# Patient Record
Sex: Male | Born: 1950 | Race: Black or African American | Hispanic: No | Marital: Married | Smoking: Never smoker
Health system: Southern US, Community
[De-identification: ages and names within clinical notes are randomized; demographics above are authoritative.]

## PROBLEM LIST (undated history)

## (undated) DIAGNOSIS — E785 Hyperlipidemia, unspecified: Secondary | ICD-10-CM

## (undated) DIAGNOSIS — E1165 Type 2 diabetes mellitus with hyperglycemia: Secondary | ICD-10-CM

## (undated) DIAGNOSIS — I10 Essential (primary) hypertension: Secondary | ICD-10-CM

## (undated) DIAGNOSIS — E1140 Type 2 diabetes mellitus with diabetic neuropathy, unspecified: Secondary | ICD-10-CM

## (undated) DIAGNOSIS — IMO0002 Reserved for concepts with insufficient information to code with codable children: Secondary | ICD-10-CM

## (undated) HISTORY — DX: Reserved for concepts with insufficient information to code with codable children: IMO0002

## (undated) HISTORY — DX: Hyperlipidemia, unspecified: E78.5

## (undated) HISTORY — DX: Type 2 diabetes mellitus with diabetic neuropathy, unspecified: E11.40

## (undated) HISTORY — DX: Type 2 diabetes mellitus with hyperglycemia: E11.65

## (undated) HISTORY — DX: Essential (primary) hypertension: I10

---

## 2010-02-03 ENCOUNTER — Ambulatory Visit: Payer: Self-pay | Admitting: Internal Medicine

## 2010-02-03 ENCOUNTER — Encounter (INDEPENDENT_AMBULATORY_CARE_PROVIDER_SITE_OTHER): Payer: Self-pay | Admitting: Family Medicine

## 2010-02-03 LAB — CONVERTED CEMR LAB
Albumin: 4 g/dL (ref 3.5–5.2)
Alkaline Phosphatase: 58 units/L (ref 39–117)
Basophils Absolute: 0 10*3/uL (ref 0.0–0.1)
Benzodiazepines.: NEGATIVE
Calcium: 9 mg/dL (ref 8.4–10.5)
Chloride: 109 meq/L (ref 96–112)
Creatinine,U: 244 mg/dL
Glucose, Bld: 120 mg/dL — ABNORMAL HIGH (ref 70–99)
HCT: 39.5 % (ref 39.0–52.0)
Hemoglobin: 12.9 g/dL — ABNORMAL LOW (ref 13.0–17.0)
Lymphocytes Relative: 35 % (ref 12–46)
Lymphs Abs: 2.1 10*3/uL (ref 0.7–4.0)
Marijuana Metabolite: NEGATIVE
Methadone: NEGATIVE
Monocytes Absolute: 0.4 10*3/uL (ref 0.1–1.0)
Monocytes Relative: 6 % (ref 3–12)
Neutro Abs: 3.4 10*3/uL (ref 1.7–7.7)
Opiate Screen, Urine: NEGATIVE
Potassium: 3.3 meq/L — ABNORMAL LOW (ref 3.5–5.3)
Propoxyphene: NEGATIVE
RBC: 4.52 M/uL (ref 4.22–5.81)
RDW: 13.8 % (ref 11.5–15.5)
Sodium: 143 meq/L (ref 135–145)
Total Protein: 6.5 g/dL (ref 6.0–8.3)
WBC: 6 10*3/uL (ref 4.0–10.5)

## 2010-02-19 ENCOUNTER — Ambulatory Visit: Payer: Self-pay | Admitting: Internal Medicine

## 2010-02-19 ENCOUNTER — Encounter (INDEPENDENT_AMBULATORY_CARE_PROVIDER_SITE_OTHER): Payer: Self-pay | Admitting: Family Medicine

## 2010-02-19 LAB — CONVERTED CEMR LAB
BUN: 9 mg/dL (ref 6–23)
Cholesterol: 238 mg/dL — ABNORMAL HIGH (ref 0–200)
Creatinine, Ser: 1.07 mg/dL (ref 0.40–1.50)
Potassium: 4 meq/L (ref 3.5–5.3)
Triglycerides: 205 mg/dL — ABNORMAL HIGH (ref <150)

## 2010-03-17 ENCOUNTER — Ambulatory Visit: Payer: Self-pay | Admitting: Internal Medicine

## 2010-04-29 ENCOUNTER — Ambulatory Visit: Payer: Self-pay | Admitting: Internal Medicine

## 2010-04-29 ENCOUNTER — Encounter (INDEPENDENT_AMBULATORY_CARE_PROVIDER_SITE_OTHER): Payer: Self-pay | Admitting: Family Medicine

## 2010-04-29 LAB — CONVERTED CEMR LAB
BUN: 15 mg/dL (ref 6–23)
Chloride: 99 meq/L (ref 96–112)
Glucose, Bld: 94 mg/dL (ref 70–99)
Potassium: 3.5 meq/L (ref 3.5–5.3)

## 2010-06-17 ENCOUNTER — Ambulatory Visit: Payer: Self-pay | Admitting: Internal Medicine

## 2011-07-19 ENCOUNTER — Emergency Department (HOSPITAL_COMMUNITY)
Admission: EM | Admit: 2011-07-19 | Discharge: 2011-07-19 | Disposition: A | Discharge status: Home / Self Care | Payer: Self-pay | Attending: Emergency Medicine | Admitting: Emergency Medicine

## 2011-07-19 DIAGNOSIS — E785 Hyperlipidemia, unspecified: Secondary | ICD-10-CM | POA: Insufficient documentation

## 2011-07-19 DIAGNOSIS — M79609 Pain in unspecified limb: Secondary | ICD-10-CM | POA: Insufficient documentation

## 2011-07-19 DIAGNOSIS — L03119 Cellulitis of unspecified part of limb: Secondary | ICD-10-CM | POA: Insufficient documentation

## 2011-07-19 DIAGNOSIS — L02619 Cutaneous abscess of unspecified foot: Secondary | ICD-10-CM | POA: Insufficient documentation

## 2011-07-19 DIAGNOSIS — I10 Essential (primary) hypertension: Secondary | ICD-10-CM | POA: Insufficient documentation

## 2014-02-05 ENCOUNTER — Encounter: Payer: Self-pay | Admitting: Internal Medicine

## 2014-02-05 ENCOUNTER — Ambulatory Visit (INDEPENDENT_AMBULATORY_CARE_PROVIDER_SITE_OTHER): Payer: Self-pay | Admitting: Internal Medicine

## 2014-02-05 ENCOUNTER — Encounter: Payer: Self-pay | Admitting: Dietician

## 2014-02-05 VITALS — BP 141/87 | HR 83 | Temp 98.6°F | Ht 72.0 in | Wt 258.1 lb

## 2014-02-05 DIAGNOSIS — IMO0002 Reserved for concepts with insufficient information to code with codable children: Secondary | ICD-10-CM | POA: Insufficient documentation

## 2014-02-05 DIAGNOSIS — R350 Frequency of micturition: Secondary | ICD-10-CM

## 2014-02-05 DIAGNOSIS — IMO0001 Reserved for inherently not codable concepts without codable children: Secondary | ICD-10-CM

## 2014-02-05 DIAGNOSIS — E119 Type 2 diabetes mellitus without complications: Secondary | ICD-10-CM

## 2014-02-05 DIAGNOSIS — I10 Essential (primary) hypertension: Secondary | ICD-10-CM | POA: Insufficient documentation

## 2014-02-05 DIAGNOSIS — E785 Hyperlipidemia, unspecified: Secondary | ICD-10-CM

## 2014-02-05 DIAGNOSIS — E1165 Type 2 diabetes mellitus with hyperglycemia: Secondary | ICD-10-CM

## 2014-02-05 DIAGNOSIS — E1140 Type 2 diabetes mellitus with diabetic neuropathy, unspecified: Secondary | ICD-10-CM | POA: Insufficient documentation

## 2014-02-05 LAB — POCT URINALYSIS DIPSTICK
Bilirubin, UA: NEGATIVE
Ketones, UA: 15
LEUKOCYTES UA: NEGATIVE
NITRITE UA: NEGATIVE
PROTEIN UA: 30
Spec Grav, UA: 1.01
UROBILINOGEN UA: 0.2
pH, UA: 5

## 2014-02-05 LAB — COMPLETE METABOLIC PANEL WITH GFR
ALK PHOS: 87 U/L (ref 39–117)
ALT: 37 U/L (ref 0–53)
AST: 19 U/L (ref 0–37)
Albumin: 4.7 g/dL (ref 3.5–5.2)
BUN: 13 mg/dL (ref 6–23)
CHLORIDE: 97 meq/L (ref 96–112)
CO2: 23 mEq/L (ref 19–32)
CREATININE: 1.11 mg/dL (ref 0.50–1.35)
Calcium: 10 mg/dL (ref 8.4–10.5)
GFR, Est African American: 81 mL/min
GFR, Est Non African American: 70 mL/min
Glucose, Bld: 362 mg/dL — ABNORMAL HIGH (ref 70–99)
POTASSIUM: 4.3 meq/L (ref 3.5–5.3)
Sodium: 134 mEq/L — ABNORMAL LOW (ref 135–145)
Total Bilirubin: 0.8 mg/dL (ref 0.2–1.2)
Total Protein: 7.7 g/dL (ref 6.0–8.3)

## 2014-02-05 LAB — LIPID PANEL
CHOL/HDL RATIO: 6.8 ratio
CHOLESTEROL: 231 mg/dL — AB (ref 0–200)
HDL: 34 mg/dL — ABNORMAL LOW (ref >39)
LDL CALC: 126 mg/dL — AB (ref 0–99)
TRIGLYCERIDES: 356 mg/dL — AB (ref <150)
VLDL: 71 mg/dL — AB (ref 0–40)

## 2014-02-05 LAB — CBC WITH DIFFERENTIAL/PLATELET
BASOS ABS: 0.1 10*3/uL (ref 0.0–0.1)
BASOS PCT: 1 % (ref 0–1)
EOS PCT: 1 % (ref 0–5)
Eosinophils Absolute: 0.1 10*3/uL (ref 0.0–0.7)
HEMATOCRIT: 45.5 % (ref 39.0–52.0)
Hemoglobin: 16.2 g/dL (ref 13.0–17.0)
LYMPHS PCT: 32 % (ref 12–46)
Lymphs Abs: 2 10*3/uL (ref 0.7–4.0)
MCH: 29.5 pg (ref 26.0–34.0)
MCHC: 35.6 g/dL (ref 30.0–36.0)
MCV: 82.7 fL (ref 78.0–100.0)
MONO ABS: 0.6 10*3/uL (ref 0.1–1.0)
Monocytes Relative: 10 % (ref 3–12)
NEUTROS ABS: 3.5 10*3/uL (ref 1.7–7.7)
Neutrophils Relative %: 56 % (ref 43–77)
PLATELETS: 259 10*3/uL (ref 150–400)
RBC: 5.5 MIL/uL (ref 4.22–5.81)
RDW: 13.6 % (ref 11.5–15.5)
WBC: 6.2 10*3/uL (ref 4.0–10.5)

## 2014-02-05 LAB — GLUCOSE, CAPILLARY: GLUCOSE-CAPILLARY: 384 mg/dL — AB (ref 70–99)

## 2014-02-05 LAB — POCT GLYCOSYLATED HEMOGLOBIN (HGB A1C): HEMOGLOBIN A1C: 11.3

## 2014-02-05 MED ORDER — ONETOUCH VERIO IQ SYSTEM W/DEVICE KIT: PACK | Status: AC

## 2014-02-05 MED ORDER — METFORMIN HCL 500 MG PO TABS: 500.0000 mg | ORAL_TABLET | Freq: Every day | ORAL | Status: DC

## 2014-02-05 NOTE — Patient Instructions (Signed)
Start taking Metformin 500 mg, one tablet once daily with breakfast. Check your blood sugar daily before breakfast and bring your glucometer to your office visit. If your symptoms worsen, please give us a call or seek medical help. Please read the material provided to you related to your diabetes and dietary changes recommended.

## 2014-02-05 NOTE — Progress Notes (Signed)
Subjective:   Patient ID: Craig Young male   DOB: 1950/10/20 63 y.o.   MRN: [id number]  HPI: [name] is a 63 y.o. gentleman with PMH significant for HTN, HLD comes to the office to the office to establish his health care with the clinic and with CC of "not feeling well" for several months.  Patient reports that [facility] when he was on prescription medications for his HTN, HLD. [facility], he had refills on his prescription medications for a year. Patient reports that he finished all his refills about 6 months and hasnt been taking any medications ever since  Patient reports that he is not feeling well for several months and has been having worsening fatigue recently. He reports occasional episodes of lightheadedness, increased urinary frequency (every hour during day and night), increased thirst, blurred vision for the similar duration of time. He reports that his fatigue is gradually getting worse to the point he stopped working a month ago. He states that he works as a Psychologist, occupational at [employer] and feels very tired all the time.  He denies any SOB, chest pain, N, V, weakness of extremities, palpitations. He reports gaining about 20 lbs in the last one year.   Patient reports that he will be eligible for medicare next month when he turns 63 and that he was waiting to get the medicare so he could see a physician.  Patient denies any other complaints.    No past medical history on file. Current Outpatient Prescriptions  Medication Sig Dispense Refill  . Blood Glucose Monitoring Suppl (ONETOUCH VERIO IQ SYSTEM) W/DEVICE KIT Check blood sugar once daily as instructed  1 kit  0  . metFORMIN (GLUCOPHAGE) 500 MG tablet Take 1 tablet (500 mg total) by mouth daily with breakfast.  60 tablet  11   No current facility-administered medications for this visit.   No family history on file. History   Social  History  . Marital Status: Married    Spouse Name: N/A    Number of Children: N/A  . Years of Education: N/A   Social History Main Topics  . Smoking status: Never Smoker   . Smokeless tobacco: None  . Alcohol Use: None  . Drug Use: None  . Sexual Activity: None   Other Topics Concern  . None   Social History Narrative  . None   Review of systems: As per HPI  Objective:  Physical Exam: Filed Vitals:   02/05/14 0912  BP: 141/87  Pulse: 83  Temp: 98.6 F (37 C)  TempSrc: Oral  Height: 6' (1.829 m)  Weight: 258 lb 1.6 oz (117.073 kg)  SpO2: 99%   Constitutional: Vital signs reviewed.  Patient is a well-developed and well-nourished and in no acute distress and cooperative with exam. Alert and oriented x3.  Head: Normocephalic and atraumatic Ear: TM normal bilaterally Nose: No erythema or drainage noted.  Turbinates normal Mouth: no erythema or exudates, MMM Eyes: PERRL, EOMI, conjunctivae normal, No scleral icterus.  Neck: Supple, Trachea midline normal ROM, No JVD, mass, thyromegaly, or carotid bruit present.  Cardiovascular: RRR, S1 normal, S2 normal, no MRG, pulses symmetric and intact bilaterally Pulmonary/Chest: normal respiratory effort, CTAB, no wheezes, rales, or rhonchi Abdominal: Soft. Non-tender, non-distended, bowel sounds are normal, no masses, organomegaly, or guarding present.  Neurological: A&O x3, Strength is normal and symmetric bilaterally, cranial nerve II-XII are  grossly intact, no focal motor deficit, sensory intact to light touch bilaterally.  Skin: Warm, dry and intact. No rash, cyanosis, or clubbing.  Psychiatric: Normal mood and affect. speech and behavior is normal. Judgment and thought content normal. Cognition and memory are normal.    Assessment & Plan:

## 2014-02-05 NOTE — Assessment & Plan Note (Signed)
Currently reasonably well controlled.  Patient appears dehydrated in the setting of chronic hyperglycemia and new onset DM, and expect his BP to run high with improvement in blood sugars. Patient was on Lisinopril/HCTZ 20/12.5 in the past.  Plans: Hold off of BP meds for now as we are starting Metformin therapy for his diabetes. (diuretics would not be indicated as he is dehydrated and ACEI would not be a good idea either) Will consider starting ACEI low dose at his next office visit (indicated for his proteinuria) Check CMP. Follow up in a week.

## 2014-02-05 NOTE — Progress Notes (Signed)
Patient ID: Dorise BullionRiffton S Young, male   DOB: 05/20/1951, 63 y.o.   MRN: [id number] Asked to provide patient with meter nad instruction on same. He is newly diagnosed with diabetes. Reports his sister is a Engineer, civil (consulting)nurse and can assist him at home with self monitoring. His mother and grandmother had diabetes and are both deceased.   He was instructed on how to use the One Touch Verio today and repeated back demonstration. He was provided with a sample meter, that comes with 10 strips and 10 lancets. He was told that when he gets Medicare, he would request a prescription for both to cover 80% of their cost.  Recommendollow up MNT and DSMT. Patient informed of medicare coverage for these services.

## 2014-02-05 NOTE — Assessment & Plan Note (Signed)
Previously was on Lipitor 20 mg. Not taking for the last 6 months.  Plans: Check FLP. Will start statin therapy at the next office visit.

## 2014-02-05 NOTE — Assessment & Plan Note (Signed)
Newly diagnosed Type 2 DM.  Patients symptoms of polyurea, polydipsia along with a CBG of 384 and A1C of 11.3 is diagnostic of DM. Discussed the case with Dr. Josem KaufmannKlima.  Plans: Patient is not willing to consider Insulin therapy at this time. Will start Metformin 500 mg QD and will increase it to BID in a week. Provided with Glucometer with few strips from the clinic. Follow up in a week. Will consider adding low dose SFU along with increasing Metformin to BID. Patient is given material regarding the role of dietary changes, life style changes, overall management of diabetes, and importance of better control of DM. Encouraged patient regarding weight loss, cutting down on carbohydrate and calorie intake. Check CBC, CMP.

## 2014-02-07 NOTE — Progress Notes (Signed)
Case discussed with Dr. Boggala at the time of the visit.  We reviewed the resident's history and exam and pertinent patient test results.  I agree with the assessment, diagnosis and plan of care documented in the resident's note. 

## 2014-02-15 ENCOUNTER — Ambulatory Visit (INDEPENDENT_AMBULATORY_CARE_PROVIDER_SITE_OTHER): Payer: Medicare Other | Admitting: Internal Medicine

## 2014-02-15 ENCOUNTER — Encounter: Payer: Self-pay | Admitting: Internal Medicine

## 2014-02-15 VITALS — BP 196/114 | HR 86 | Temp 97.4°F | Ht 72.0 in | Wt 252.0 lb

## 2014-02-15 DIAGNOSIS — E1149 Type 2 diabetes mellitus with other diabetic neurological complication: Secondary | ICD-10-CM

## 2014-02-15 DIAGNOSIS — E1165 Type 2 diabetes mellitus with hyperglycemia: Principal | ICD-10-CM

## 2014-02-15 DIAGNOSIS — E785 Hyperlipidemia, unspecified: Secondary | ICD-10-CM

## 2014-02-15 DIAGNOSIS — I10 Essential (primary) hypertension: Secondary | ICD-10-CM

## 2014-02-15 DIAGNOSIS — IMO0001 Reserved for inherently not codable concepts without codable children: Secondary | ICD-10-CM

## 2014-02-15 LAB — GLUCOSE, CAPILLARY: Glucose-Capillary: 297 mg/dL — ABNORMAL HIGH (ref 70–99)

## 2014-02-15 MED ORDER — LISINOPRIL 10 MG PO TABS: 10.0000 mg | ORAL_TABLET | Freq: Every day | ORAL | Status: DC

## 2014-02-15 MED ORDER — GLIPIZIDE 10 MG PO TABS: 10.0000 mg | ORAL_TABLET | Freq: Every day | ORAL | Status: DC

## 2014-02-15 MED ORDER — LOVASTATIN 20 MG PO TABS: 20.0000 mg | ORAL_TABLET | Freq: Every day | ORAL | Status: DC

## 2014-02-15 MED ORDER — GLUCOSE BLOOD VI STRP: ORAL_STRIP | Status: DC

## 2014-02-15 NOTE — Assessment & Plan Note (Addendum)
BP Readings from Last 3 Encounters:  02/15/14 196/114  02/05/14 141/87    Lab Results  Component Value Date   NA 134* 02/05/2014   K 4.3 02/05/2014   CREATININE 1.11 02/05/2014    Assessment: Blood pressure control: severely elevated Progress toward BP goal:  unchanged Comments: BP significantly elevated today. The patient notes he was previously well managed on lisinopril/HCTZ. Given his polyuria from hyperglycemia, I do not believe starting a diuretic at this time is wise.  Plan: Medications:  Start Lisinopril 10 mg daily Educational resources provided:   Self management tools provided:   Other plans: check BMET at next visit.  Recheck BP in 2 weeks.

## 2014-02-15 NOTE — Assessment & Plan Note (Signed)
Given the patient's history of DM, the patient qualifies for statin treatment.  Of note, LDL = 126 at last visit.  Pt is very concerned about the cost of medications. -start Lovastatin (as of now, the only $4 statin)

## 2014-02-15 NOTE — Progress Notes (Signed)
HPI The patient is a 63 y.o. male with a history of DM, HTN, HL, presenting for a follow-up visit for diabetes.  The patient has a history of DM2.  At his last visit, A1C was 11.3, after being out of medications x6 months.  He was started on Metformin 500 mg daily.  The patient notes he has been making significant diet changes, and reports losing 6 lbs since the last visit, mostly due to poor appetite (?side effect of metformin).  Glucometer review reveals only 4 blood sugar readings, all of which were in the 300's.  He continues to note transient blurry vision, polyuria, polydipsia, and fatigue.  The patient has never been on insulin therapy, and notes that at [facility] he was never diagnosed with diabetes.  The patient has a history of HTN.  BP today is significantly elevated.  At the patient's last visit, LDL was found to be 126.  The patient is currently not on a statin medication.  ROS: General: no fevers, chills Skin: no rash HEENT: no hearing changes, sore throat Pulm: no dyspnea, coughing, wheezing CV: no chest pain, palpitations, shortness of breath Abd: no abdominal pain, nausea/vomiting, diarrhea/constipation GU: no dysuria, hematuria Ext: no arthralgias, myalgias Neuro: no weakness, numbness, or tingling  Filed Vitals:   02/15/14 0930  BP: 196/114  Pulse: 86  Temp: 97.4 F (36.3 C)    PEX General: alert, cooperative, and in no apparent distress HEENT: pupils equal round and reactive to light, vision grossly intact, oropharynx clear and non-erythematous  Neck: supple Lungs: clear to ascultation bilaterally, normal work of respiration, no wheezes, rales, ronchi Heart: regular rate and rhythm, no murmurs, gallops, or rubs Abdomen: soft, non-tender, non-distended, normal bowel sounds Extremities: 2+ DP/PT pulses bilaterally, decreased sensation in bilateral 1st toes, no cyanosis, clubbing, or edema Neurologic: alert & oriented X3, cranial nerves II-XII intact, strength  grossly intact  Current Outpatient Prescriptions on File Prior to Visit  Medication Sig Dispense Refill  . Blood Glucose Monitoring Suppl (ONETOUCH VERIO IQ SYSTEM) W/DEVICE KIT Check blood sugar once daily as instructed  1 kit  0  . metFORMIN (GLUCOPHAGE) 500 MG tablet Take 1 tablet (500 mg total) by mouth daily with breakfast.  60 tablet  11   No current facility-administered medications on file prior to visit.    Assessment/Plan

## 2014-02-15 NOTE — Assessment & Plan Note (Signed)
Lab Results  Component Value Date   HGBA1C 11.3 02/05/2014     Assessment: Diabetes control: poor control (HgbA1C >9%) Progress toward A1C goal:  unchanged Comments: The patient's was diagnosed with diabetes at his last visit, with an A1c of 11.3. His glucometer shows blood sugars remaining in the 300s, with symptoms of blurred vision, polyuria, polydipsia. I want to continue to uptitrate his metformin, but given his high blood sugars and high A1C, I feel that metformin monotherapy won't be able to get his A1C to goal.  Pt is resistant to starting insulin.  We will start Glipizide today.  Plan: Medications:  Continue to uptitrate Metformin - 500 mg BID starting today, 1000 & 500 mg 5/8, then 1000 mg BID 5/15.  Start Glipizide 10 mg daily today. Home glucose monitoring: Frequency: once a day Timing: before meals Instruction/counseling given: discussed foot care and discussed diet Educational resources provided:   Self management tools provided:   Other plans: foot exam, urine microalbumin performed today.  Consider pneumovax and eye exam at next visit

## 2014-02-15 NOTE — Patient Instructions (Addendum)
General Instructions: For your Diabetes: -Your A1C (a 10925-month summary of your blood sugars) was 11.3 at your last visit.  Our goal is to get this number down to 7, to reduce the risk of diabetes affecting your eyes, kidneys, and nerves. -Today, start taking Metformin 1 tablet twice per day -Next Friday, May 8th, start taking Metformin 2 tablets in the morning, and 1 tablet in the evening -Then, on Friday, May 15th, start taking Metformin 2 tablets, twice per day -We are also starting a new medication, Glipizide, 1 tablet once per day  For your High Blood Pressure: -we are prescribing Lisinopril, take 1 tablet once per day  For your High Cholesterol: -we are prescribing Lovastatin, 1 tablet once per day  Please return for a follow-up visit in 2 weeks.   Treatment Goals:  Goals (1 Years of Data) as of 02/15/14   None      Progress Toward Treatment Goals:  Treatment Goal 02/15/2014  Hemoglobin A1C unchanged  Blood pressure unchanged    Self Care Goals & Plans:  No flowsheet data found.  Home Blood Glucose Monitoring 02/15/2014  Check my blood sugar once a day  When to check my blood sugar before meals     Care Management & Community Referrals:  Referral 02/15/2014  Referrals made for care management support none needed

## 2014-02-15 NOTE — Progress Notes (Signed)
Case discussed with Dr. Brown at the time of the visit.  We reviewed the resident's history and exam and pertinent patient test results.  I agree with the assessment, diagnosis, and plan of care documented in the resident's note. 

## 2014-02-16 LAB — MICROALBUMIN / CREATININE URINE RATIO
Creatinine, Urine: 142 mg/dL
MICROALB UR: 5.8 mg/dL — AB (ref 0.00–1.89)
MICROALB/CREAT RATIO: 40.8 mg/g — AB (ref 0.0–30.0)

## 2014-03-01 ENCOUNTER — Ambulatory Visit (INDEPENDENT_AMBULATORY_CARE_PROVIDER_SITE_OTHER): Payer: Medicare Other | Admitting: Internal Medicine

## 2014-03-01 ENCOUNTER — Encounter: Payer: Self-pay | Admitting: Internal Medicine

## 2014-03-01 VITALS — BP 151/83 | HR 104 | Temp 99.4°F | Ht 72.0 in | Wt 259.6 lb

## 2014-03-01 DIAGNOSIS — E785 Hyperlipidemia, unspecified: Secondary | ICD-10-CM

## 2014-03-01 DIAGNOSIS — E1142 Type 2 diabetes mellitus with diabetic polyneuropathy: Secondary | ICD-10-CM

## 2014-03-01 DIAGNOSIS — Z0000 Encounter for general adult medical examination without abnormal findings: Secondary | ICD-10-CM | POA: Insufficient documentation

## 2014-03-01 DIAGNOSIS — I10 Essential (primary) hypertension: Secondary | ICD-10-CM

## 2014-03-01 DIAGNOSIS — E1149 Type 2 diabetes mellitus with other diabetic neurological complication: Secondary | ICD-10-CM

## 2014-03-01 DIAGNOSIS — E1140 Type 2 diabetes mellitus with diabetic neuropathy, unspecified: Secondary | ICD-10-CM

## 2014-03-01 DIAGNOSIS — E1165 Type 2 diabetes mellitus with hyperglycemia: Principal | ICD-10-CM

## 2014-03-01 DIAGNOSIS — IMO0002 Reserved for concepts with insufficient information to code with codable children: Secondary | ICD-10-CM

## 2014-03-01 LAB — GLUCOSE, CAPILLARY: Glucose-Capillary: 148 mg/dL — ABNORMAL HIGH (ref 70–99)

## 2014-03-01 MED ORDER — METFORMIN HCL 1000 MG PO TABS: 1000.0000 mg | ORAL_TABLET | Freq: Two times a day (BID) | ORAL | Status: DC

## 2014-03-01 MED ORDER — LISINOPRIL 10 MG PO TABS: 10.0000 mg | ORAL_TABLET | Freq: Every day | ORAL | Status: DC

## 2014-03-01 NOTE — Patient Instructions (Signed)
General Instructions:  1. Please schedule a follow up appointment for 4 weeks  2. Please take all medications as prescribed. Continue to take Metformin 1000 mg twice daily in addition to other medications.  3. If you have worsening of your symptoms or new symptoms arise, please call the clinic [phone]), or go to the ER immediately if symptoms are severe.     Thank you for bringing your medicines today. This helps us keep you safe from mistakes.   Progress Toward Treatment Goals:  Treatment Goal 03/01/2014  Hemoglobin A1C unable to assess  Blood pressure at goal    Self Care Goals & Plans:  Self Care Goal 03/01/2014  Manage my medications take my medicines as prescribed; bring my medications to every visit; refill my medications on time  Monitor my health keep track of my blood glucose  Eat healthy foods drink diet soda or water instead of juice or soda; eat foods that are low in salt; eat more vegetables  Be physically active find an activity I enjoy; take a walk every day  Meeting treatment goals maintain the current self-care plan    Home Blood Glucose Monitoring 03/01/2014  Check my blood sugar 2 times a day  When to check my blood sugar N/A     Care Management & Community Referrals:  Referral 03/01/2014  Referrals made for care management support -  Referrals made to community resources none

## 2014-03-01 NOTE — Progress Notes (Signed)
Patient ID: Craig Young, male   DOB: Jan 22, 1951, 63 y.o.   MRN: [id number]   Subjective:   Patient ID: Craig Young male   DOB: 1951/08/08 63 y.o.   MRN: [id number]  HPI: [name] is a 63 y.o. y/o male w/ PMHx of HTN, HLD, and newly diagnosed DM type II, presents to the clinic today for a follow-up visit. During the patient's last visit (02/15/14), his Metformin was titrated up to 1000 mg bid (starting today), started Glipizide 10 mg po qd, and Lisinopril 10 mg po qd. Today he has very few complaints, says his vision, dizziness, and general "unwell" feeling has almost completely subsided. He comes w/ his glucometer today and most values are somewhere b/w 180-200, but some were lower around 130, some higher around 230. He denies any symptoms of lightheadedness, diaphoresis, tremulousness, or LOC. He very clearly understands he is to eat w/ his Glipizide. He also claims that he is no longer having GI issues w/ Metformin as he was initially on starting the medication. No further issues.  Past Medical History  Diagnosis Date  . Type 2 diabetes, uncontrolled, with neuropathy   . HTN (hypertension), benign   . Hyperlipidemia    Current Outpatient Prescriptions  Medication Sig Dispense Refill  . Blood Glucose Monitoring Suppl (ONETOUCH VERIO IQ SYSTEM) W/DEVICE KIT Check blood sugar once daily as instructed  1 kit  0  . glipiZIDE (GLUCOTROL) 10 MG tablet Take 1 tablet (10 mg total) by mouth daily.  60 tablet  11  . glucose blood (ONETOUCH VERIO) test strip Use as instructed to check blood sugar 3 times per day.  Dx code 250.00  100 each  12  . lisinopril (PRINIVIL,ZESTRIL) 10 MG tablet Take 1 tablet (10 mg total) by mouth daily.  30 tablet  0  . lovastatin (MEVACOR) 20 MG tablet Take 1 tablet (20 mg total) by mouth daily.  30 tablet  11  . metFORMIN (GLUCOPHAGE) 500 MG tablet Take 1 tablet (500 mg total) by mouth daily with breakfast.  60 tablet  11   No current  facility-administered medications for this visit.   No family history on file. History   Social History  . Marital Status: Married    Spouse Name: N/A    Number of Children: N/A  . Years of Education: N/A   Social History Main Topics  . Smoking status: Never Smoker   . Smokeless tobacco: None  . Alcohol Use: None  . Drug Use: None  . Sexual Activity: None   Other Topics Concern  . None   Social History Narrative  . None    Review of Systems: General: Denies fever, chills, diaphoresis, appetite change and fatigue.  Respiratory: Denies SOB, DOE, cough, chest tightness, and wheezing.   Cardiovascular: Denies chest pain and palpitations.  Gastrointestinal: Denies nausea, vomiting, abdominal pain, diarrhea, constipation, blood in stool and abdominal distention.  Genitourinary: Denies dysuria, urgency, frequency, hematuria, and flank pain. Endocrine: Denies hot or cold intolerance, polyuria, and polydipsia. Musculoskeletal: Denies myalgias, back pain, joint swelling, arthralgias and gait problem.  Skin: Denies pallor, rash and wounds.  Neurological: Positive for mild dizziness. Denies seizures, syncope, weakness, lightheadedness, numbness and headaches.  Psychiatric/Behavioral: Denies mood changes, confusion, nervousness, sleep disturbance and agitation.  Objective:   Physical Exam: Filed Vitals:   03/01/14 1401  BP: 151/83  Pulse: 104  Temp: 99.4 F (37.4 C)  TempSrc: Oral  Height: 6' (1.829 m)  Weight: 259 lb 9.6  oz (117.754 kg)  SpO2: 100%   General: Vital signs reviewed.  Patient is a well-developed and well-nourished, in no acute distress and cooperative with exam.  Head: Normocephalic and atraumatic. Eyes: PERRL, EOMI, conjunctivae normal, No scleral icterus.  Neck: Supple, trachea midline, normal ROM, No JVD, masses, thyromegaly, or carotid bruit present.  Cardiovascular: RRR, S1 normal, S2 normal, no murmurs, gallops, or rubs. Pulmonary/Chest: Air entry equal  bilaterally, no wheezes, rales, or rhonchi. Abdominal: Soft, non-tender, non-distended, BS +, no masses, organomegaly, or guarding present.  Musculoskeletal: No joint deformities, erythema, or stiffness, ROM full and nontender. Extremities: No swelling or edema,  pulses symmetric and intact bilaterally. No cyanosis or clubbing. Neurological: A&O x3, Strength is normal and symmetric bilaterally, cranial nerve II-XII are grossly intact, no focal motor deficit, sensory intact to light touch bilaterally, mild decreased sensation in feet bilaterally.   Skin: Warm, dry and intact. No rashes or erythema. Psychiatric: Normal mood and affect. speech and behavior is normal. Cognition and memory are normal.   Assessment & Plan:   Please see problem based assessment and plan.

## 2014-03-01 NOTE — Assessment & Plan Note (Signed)
BP Readings from Last 3 Encounters:  03/01/14 151/83  02/15/14 196/114  02/05/14 141/87    Lab Results  Component Value Date   NA 134* 02/05/2014   K 4.3 02/05/2014   CREATININE 1.11 02/05/2014    Assessment: Blood pressure control: controlled Progress toward BP goal:  at goal Comments: Currently on Lisinopril 10 mg po qd  Plan: Medications:  continue current medications Educational resources provided:   Self management tools provided:   Other plans: If BP elevated at next visit, increase Lisinopril or add Norvasc 5 mg po qd.

## 2014-03-01 NOTE — Assessment & Plan Note (Addendum)
Lab Results  Component Value Date   HGBA1C 11.3 02/05/2014     Assessment: Diabetes control: poor control (HgbA1C >9%) Progress toward A1C goal:  unable to assess Comments: Currently on Metformin 1000 mg bid (just increased today) + Glipizide 10 mg po qd. As per glucometer, patient's CBG's are around 180-200 on average. Also on Lisinopril 10 mg po qd.  Plan: Medications:  continue current medications Home glucose monitoring: Frequency: 2 times a day Timing: N/A Instruction/counseling given: reminded to bring blood glucose meter & log to each visit Educational resources provided:   Self management tools provided:   Other plans: Will likely increase Glipizide at next visit. Will also discuss ophthalmology referral at next visit, unable to receive retinal scan today.

## 2014-03-01 NOTE — Assessment & Plan Note (Signed)
Continue Lovastatin 20 mg qhs

## 2014-03-01 NOTE — Assessment & Plan Note (Signed)
Given referral for colonoscopy today.

## 2014-03-05 NOTE — Progress Notes (Signed)
Case discussed with Dr. Jones at the time of the visit.  We reviewed the resident's history and exam and pertinent patient test results.  I agree with the assessment, diagnosis, and plan of care documented in the resident's note. 

## 2014-03-12 ENCOUNTER — Encounter: Payer: Self-pay | Admitting: *Deleted

## 2014-03-19 ENCOUNTER — Ambulatory Visit: Payer: Medicare Other | Admitting: Internal Medicine

## 2014-03-29 ENCOUNTER — Encounter: Payer: Self-pay | Admitting: Internal Medicine

## 2014-03-29 ENCOUNTER — Ambulatory Visit (INDEPENDENT_AMBULATORY_CARE_PROVIDER_SITE_OTHER): Payer: Medicare Other | Admitting: Internal Medicine

## 2014-03-29 VITALS — BP 137/87 | HR 77 | Temp 98.8°F | Ht 72.0 in | Wt 255.1 lb

## 2014-03-29 DIAGNOSIS — E1140 Type 2 diabetes mellitus with diabetic neuropathy, unspecified: Secondary | ICD-10-CM

## 2014-03-29 DIAGNOSIS — E1142 Type 2 diabetes mellitus with diabetic polyneuropathy: Secondary | ICD-10-CM

## 2014-03-29 DIAGNOSIS — I10 Essential (primary) hypertension: Secondary | ICD-10-CM

## 2014-03-29 DIAGNOSIS — E785 Hyperlipidemia, unspecified: Secondary | ICD-10-CM

## 2014-03-29 DIAGNOSIS — IMO0002 Reserved for concepts with insufficient information to code with codable children: Secondary | ICD-10-CM

## 2014-03-29 DIAGNOSIS — E1149 Type 2 diabetes mellitus with other diabetic neurological complication: Secondary | ICD-10-CM

## 2014-03-29 DIAGNOSIS — E1165 Type 2 diabetes mellitus with hyperglycemia: Secondary | ICD-10-CM

## 2014-03-29 LAB — HM DIABETES EYE EXAM

## 2014-03-29 LAB — GLUCOSE, CAPILLARY: GLUCOSE-CAPILLARY: 109 mg/dL — AB (ref 70–99)

## 2014-03-29 MED ORDER — LOVASTATIN 20 MG PO TABS: 20.0000 mg | ORAL_TABLET | Freq: Every day | ORAL | Status: DC

## 2014-03-29 MED ORDER — LISINOPRIL 10 MG PO TABS
10.0000 mg | ORAL_TABLET | Freq: Every day | ORAL | Status: DC
Start: 2014-03-29 — End: 2015-07-15

## 2014-03-29 MED ORDER — LISINOPRIL 10 MG PO TABS: 10.0000 mg | ORAL_TABLET | Freq: Every day | ORAL | Status: DC

## 2014-03-29 MED ORDER — GLIPIZIDE 10 MG PO TABS: 10.0000 mg | ORAL_TABLET | Freq: Every day | ORAL | Status: DC

## 2014-03-29 MED ORDER — LOVASTATIN 20 MG PO TABS
20.0000 mg | ORAL_TABLET | Freq: Every day | ORAL | Status: DC
Start: 2014-03-29 — End: 2014-03-29

## 2014-03-29 NOTE — Assessment & Plan Note (Signed)
Refill Lovastatin today

## 2014-03-29 NOTE — Assessment & Plan Note (Signed)
BP Readings from Last 3 Encounters:  03/29/14 137/87  03/01/14 151/83  02/15/14 196/114    Lab Results  Component Value Date   NA 134* 02/05/2014   K 4.3 02/05/2014   CREATININE 1.11 02/05/2014    Assessment: Blood pressure control: controlled Progress toward BP goal:  at goal Comments: Compliant w/ Lisinopril  Plan: Medications:  continue current medications; Lisinopril 10 mg po qd (refilled today) Educational resources provided: brochure Self management tools provided:   Other plans: RTC in 2 months

## 2014-03-29 NOTE — Assessment & Plan Note (Addendum)
Lab Results  Component Value Date   HGBA1C 11.3 02/05/2014     Assessment: Diabetes control: poor control (HgbA1C >9%) Progress toward A1C goal:  improved Comments: Patient very compliant and interested in improving his DM control. Taking Metformin 1000 mg bid (rarely skips a half-dose when he has abdominal pain) + Glipizide 10 mg po qd. Glucose meter shows CBG's averaging in the 150's. Highest 220 in the afternoon. Fasting AM CBG's in the 130's. Patient also admits to symptomatic hypoglycemia surrounding his Glipizide. Discussed importance of eating w/ this medication, explained that it can cause significant hypoglycemia, now he understands.   Plan: Medications:  continue current medications; Metformin 1000 mg bid + Glipizide 10 mg qAM Home glucose monitoring: Frequency: 2 times a day Timing: before breakfast;before lunch Instruction/counseling given: reminded to bring blood glucose meter & log to each visit Educational resources provided: brochure Self management tools provided: home glucose logbook;copy of home glucose meter download Other plans: Retinal exam today.  -Patient to see Norm ParcelDonna Plyler at next clinic visit in 2 months.  -Repeat HbA1c at next visit

## 2014-03-29 NOTE — Progress Notes (Signed)
Subjective:   Patient ID: Craig Young male   DOB: [birth year] 63 y.o.   MRN: [id number]  HPI: Craig Young is a 63 y.o. y/o male w/ PMHx of HTN, HLD, and newly diagnosed DM type II, presents to the clinic today for a follow-up visit. Patient was last seen in the clinic on 03/01/14. Today, patient w/ improved blood pressure and blood sugars. Patient has been taking Metformin 1000 mg bid, Glipizide 10 mg qd, Lisinopril 10 mg qd and lovastatin 20 mg qd. He claims the Metformin causes him some mild abdominal pain at times but says that this is improving. The patient also claims he has had only a couple episodes of symptomatic hypoglycemia in the mornings w/ tremulousness and lightheadedness, which seem to be associated w/ taking Glipizide and not eating adequately. He denies blurry vision.  Patient claims he has started to feel much better recently now that his blood sugars are under control. He has been checking at home 2 times daily (before breakfast and before lunch), and shows an average around 160's. Fasting AM blood sugars seem to be around the 140-150 range. The highest he has documented since his last visit was 221. The patient also claims he has been eating healthier and going for a long walk every day. No further issues.   Past Medical History  Diagnosis Date  . Type 2 diabetes, uncontrolled, with neuropathy   . HTN (hypertension), benign   . Hyperlipidemia    Current Outpatient Prescriptions  Medication Sig Dispense Refill  . Blood Glucose Monitoring Suppl (ONETOUCH VERIO IQ SYSTEM) W/DEVICE KIT Check blood sugar once daily as instructed  1 kit  0  . glipiZIDE (GLUCOTROL) 10 MG tablet Take 1 tablet (10 mg total) by mouth daily.  60 tablet  11  . glucose blood (ONETOUCH VERIO) test strip Use as instructed to check blood sugar 3 times per day.  Dx code 250.00  100 each  12  . lisinopril (PRINIVIL,ZESTRIL) 10 MG tablet Take 1 tablet (10 mg total) by mouth daily.  30 tablet  5    . lovastatin (MEVACOR) 20 MG tablet Take 1 tablet (20 mg total) by mouth daily.  30 tablet  11  . metFORMIN (GLUCOPHAGE) 1000 MG tablet Take 1 tablet (1,000 mg total) by mouth 2 (two) times daily with a meal.  60 tablet  11   No current facility-administered medications for this visit.   No family history on file. History   Social History  . Marital Status: Married    Spouse Name: N/A    Number of Children: N/A  . Years of Education: N/A   Social History Main Topics  . Smoking status: Never Smoker   . Smokeless tobacco: None  . Alcohol Use: None  . Drug Use: None  . Sexual Activity: None   Other Topics Concern  . None   Social History Narrative  . None    Review of Systems: General: Denies fever, chills, diaphoresis, appetite change and fatigue.  Respiratory: Denies SOB, DOE, cough, chest tightness, and wheezing.   Cardiovascular: Denies chest pain and palpitations.  Gastrointestinal: Denies nausea, vomiting, abdominal pain, diarrhea, constipation, blood in stool and abdominal distention.  Genitourinary: Denies dysuria, urgency, frequency, hematuria, and flank pain. Endocrine: Denies hot or cold intolerance, polyuria, and polydipsia. Musculoskeletal: Denies myalgias, back pain, joint swelling, arthralgias and gait problem.  Skin: Denies pallor, rash and wounds.  Neurological: Positive for mild dizziness, tremulousness, and lightheadedness. Denies seizures, syncope, weakness,  numbness and headaches.  Psychiatric/Behavioral: Denies mood changes, confusion, nervousness, sleep disturbance and agitation.  Objective:   Physical Exam: Filed Vitals:   03/29/14 0924  BP: 137/87  Pulse: 77  Temp: 98.8 F (37.1 C)  TempSrc: Oral  Height: 6' (1.829 m)  Weight: 255 lb 1.6 oz (115.713 kg)  SpO2: 100%   General: Vital signs reviewed.  Patient is a well-developed and well-nourished, in no acute distress and cooperative with exam.  Head: Normocephalic and atraumatic. Eyes:  PERRL, EOMI, conjunctivae normal, No scleral icterus.  Neck: Supple, trachea midline, normal ROM, No JVD, masses, thyromegaly, or carotid bruit present.  Cardiovascular: RRR, S1 normal, S2 normal, no murmurs, gallops, or rubs. Pulmonary/Chest: Air entry equal bilaterally, no wheezes, rales, or rhonchi. Abdominal: Soft, non-tender, non-distended, BS +, no masses, organomegaly, or guarding present.  Musculoskeletal: No joint deformities, erythema, or stiffness, ROM full and nontender. Extremities: No swelling or edema,  pulses symmetric and intact bilaterally. No cyanosis or clubbing. Neurological: A&O x3, Strength is normal and symmetric bilaterally, cranial nerve II-XII are grossly intact, no focal motor deficit, sensory intact to light touch bilaterally.  Skin: Warm, dry and intact. No rashes or erythema. Psychiatric: Normal mood and affect. Speech and behavior is normal. Cognition and memory are normal.   Assessment & Plan:   Please see problem based assessment and plan.

## 2014-03-29 NOTE — Patient Instructions (Addendum)
General Instructions:  1. Please schedule a follow up appointment for 2 months  MAKE AN APPOINTMENT W/ DONNA PLYLER (DIABETES EDUCATOR) DURING NEXT VISIT.  2. Please take all medications as prescribed.   3. If you have worsening of your symptoms or new symptoms arise, please call the clinic [phone]), or go to the ER immediately if symptoms are severe.  You have done a great job in taking all your medications. I appreciate it very much. Please continue doing that.   Thank you for bringing your medicines today. This helps us keep you safe from mistakes.   Progress Toward Treatment Goals:  Treatment Goal 03/29/2014  Hemoglobin A1C improved  Blood pressure at goal    Self Care Goals & Plans:  Self Care Goal 03/29/2014  Manage my medications take my medicines as prescribed; bring my medications to every visit; refill my medications on time  Monitor my health keep track of my blood glucose; bring my glucose meter and log to each visit  Eat healthy foods drink diet soda or water instead of juice or soda; eat more vegetables; eat foods that are low in salt; eat baked foods instead of fried foods; eat fruit for snacks and desserts  Be physically active find an activity I enjoy; take a walk every day  Meeting treatment goals maintain the current self-care plan    Home Blood Glucose Monitoring 03/29/2014  Check my blood sugar 2 times a day  When to check my blood sugar before breakfast; before lunch     Care Management & Community Referrals:  Referral 03/29/2014  Referrals made for care management support diabetes educator  Referrals made to community resources none

## 2014-04-01 NOTE — Progress Notes (Signed)
Case discussed with Dr. Jones at the time of the visit.  We reviewed the resident's history and exam and pertinent patient test results.  I agree with the assessment, diagnosis, and plan of care documented in the resident's note. 

## 2014-04-10 ENCOUNTER — Encounter: Payer: Self-pay | Admitting: Internal Medicine

## 2014-04-12 NOTE — Addendum Note (Signed)
Addended by: Neomia DearPOWERS, SHARON E on: 04/12/2014 05:52 PM   Modules accepted: Orders

## 2014-04-23 NOTE — Addendum Note (Signed)
Addended by: Bufford SpikesFULCHER, Yuna Pizzolato N on: 04/23/2014 03:13 PM   Modules accepted: Orders

## 2014-05-28 ENCOUNTER — Ambulatory Visit (INDEPENDENT_AMBULATORY_CARE_PROVIDER_SITE_OTHER): Payer: Medicare Other | Admitting: Dietician

## 2014-05-28 ENCOUNTER — Encounter: Payer: Self-pay | Admitting: Internal Medicine

## 2014-05-28 ENCOUNTER — Ambulatory Visit (INDEPENDENT_AMBULATORY_CARE_PROVIDER_SITE_OTHER): Payer: Medicare Other | Admitting: Internal Medicine

## 2014-05-28 VITALS — BP 125/80 | HR 66 | Temp 99.0°F | Ht 72.0 in | Wt 253.2 lb

## 2014-05-28 DIAGNOSIS — E1140 Type 2 diabetes mellitus with diabetic neuropathy, unspecified: Secondary | ICD-10-CM

## 2014-05-28 DIAGNOSIS — E1142 Type 2 diabetes mellitus with diabetic polyneuropathy: Secondary | ICD-10-CM

## 2014-05-28 DIAGNOSIS — E1149 Type 2 diabetes mellitus with other diabetic neurological complication: Secondary | ICD-10-CM

## 2014-05-28 DIAGNOSIS — E1165 Type 2 diabetes mellitus with hyperglycemia: Principal | ICD-10-CM

## 2014-05-28 DIAGNOSIS — IMO0002 Reserved for concepts with insufficient information to code with codable children: Secondary | ICD-10-CM

## 2014-05-28 DIAGNOSIS — I10 Essential (primary) hypertension: Secondary | ICD-10-CM

## 2014-05-28 LAB — POCT GLYCOSYLATED HEMOGLOBIN (HGB A1C): HEMOGLOBIN A1C: 6.5

## 2014-05-28 LAB — GLUCOSE, CAPILLARY: Glucose-Capillary: 90 mg/dL (ref 70–99)

## 2014-05-28 NOTE — Assessment & Plan Note (Signed)
BP Readings from Last 3 Encounters:  05/28/14 125/80  03/29/14 137/87  03/01/14 151/83    Lab Results  Component Value Date   NA 134* 02/05/2014   K 4.3 02/05/2014   CREATININE 1.11 02/05/2014    Assessment: Blood pressure control: controlled Progress toward BP goal:  at goal Comments: Patient has been very compliant w/ all of his medication since diagnosed w/ DM type II in early spring. He also says he has been exercising 4-5 times daily and watching his diet.  Plan: Medications:  continue current medications; Lisinopril 10 mg po qd Educational resources provided: brochure Self management tools provided:   Other plans: Return to clinic in 3 months.

## 2014-05-28 NOTE — Patient Instructions (Signed)
Please keep waling like you have been and cutting portions down to help you loose the 30# you want to loose.   Congrats on your A1C!!!!  [name] [phone]

## 2014-05-28 NOTE — Progress Notes (Signed)
Subjective:   Patient ID: Craig Young male   DOB: 12-Feb-1951 63 y.o.   MRN: [id number]  HPI: Craig Young is a 63 y.o. y/o male w/ PMHx of HTN, HLD, and newly diagnosed DM type II, presents to the clinic today for a follow-up visit. Patient was last seen in the clinic on 03/29/14. Patient has been taking Metformin 1000 mg bid, Glipizide 10 mg qd, Lisinopril 10 mg qd and lovastatin 20 mg qd, however, the patient claims he has been having symptoms of hypoglycemia when he takes the Glipizide; specifically lightheadedness, tremulousness, and diaphoresis. Because of this he claims he has only bee taking the Glipizide 1-2 times weekly. He otherwise claims to have been very compliant with the rest of his medications. He checks his blood sugar 2 times daily, when he wakes up and before lunch. His meter shows CBG's from 80-200, but average is 148. Most importantly, he claims he has been being very careful about what he eats and says he walks 4 miles 4 times daily. No other complaints. Weight is 253 lbs, decreased form 260 lbs on 03/01/14.   Past Medical History  Diagnosis Date  . Type 2 diabetes, uncontrolled, with neuropathy   . HTN (hypertension), benign   . Hyperlipidemia    Current Outpatient Prescriptions  Medication Sig Dispense Refill  . Blood Glucose Monitoring Suppl (ONETOUCH VERIO IQ SYSTEM) W/DEVICE KIT Check blood sugar once daily as instructed  1 kit  0  . glipiZIDE (GLUCOTROL) 10 MG tablet Take 1 tablet (10 mg total) by mouth daily.  30 tablet  6  . glucose blood (ONETOUCH VERIO) test strip Use as instructed to check blood sugar 3 times per day.  Dx code 250.00  100 each  12  . lisinopril (PRINIVIL,ZESTRIL) 10 MG tablet Take 1 tablet (10 mg total) by mouth daily.  30 tablet  5  . lovastatin (MEVACOR) 20 MG tablet Take 1 tablet (20 mg total) by mouth daily.  30 tablet  11  . metFORMIN (GLUCOPHAGE) 1000 MG tablet Take 1 tablet (1,000 mg total) by mouth 2 (two) times daily with a  meal.  60 tablet  11   No current facility-administered medications for this visit.   No family history on file. History   Social History  . Marital Status: Married    Spouse Name: N/A    Number of Children: N/A  . Years of Education: N/A   Social History Main Topics  . Smoking status: Never Smoker   . Smokeless tobacco: None  . Alcohol Use: None  . Drug Use: None  . Sexual Activity: None   Other Topics Concern  . None   Social History Narrative  . None    Review of Systems: General: Denies fever, chills, diaphoresis, appetite change and fatigue.  Respiratory: Denies SOB, DOE, cough, chest tightness, and wheezing.   Cardiovascular: Denies chest pain and palpitations.  Gastrointestinal: Denies nausea, vomiting, abdominal pain, diarrhea, constipation, blood in stool and abdominal distention.  Genitourinary: Denies dysuria, urgency, frequency, hematuria, and flank pain. Endocrine: Denies hot or cold intolerance, polyuria, and polydipsia. Musculoskeletal: Denies myalgias, back pain, joint swelling, arthralgias and gait problem.  Skin: Denies pallor, rash and wounds.  Neurological: Positive for mild dizziness, tremulousness, and lightheadedness. Denies seizures, syncope, weakness, numbness and headaches.  Psychiatric/Behavioral: Denies mood changes, confusion, nervousness, sleep disturbance and agitation.  Objective:   Physical Exam: Filed Vitals:   05/28/14 1318  BP: 125/80  Pulse: 66  Temp: 99  F (37.2 C)  TempSrc: Oral  Height: 6' (1.829 m)  Weight: 253 lb 3.2 oz (114.851 kg)  SpO2: 100%   General: Vital signs reviewed.  Patient is a well-developed and well-nourished, in no acute distress and cooperative with exam.  Head: Normocephalic and atraumatic. Eyes: PERRL, EOMI, conjunctivae normal, No scleral icterus.  Neck: Supple, trachea midline, normal ROM, No JVD, masses, thyromegaly, or carotid bruit present.  Cardiovascular: RRR, S1 normal, S2 normal, no murmurs,  gallops, or rubs. Pulmonary/Chest: Air entry equal bilaterally, no wheezes, rales, or rhonchi. Abdominal: Soft, non-tender, non-distended, BS +, no masses, organomegaly, or guarding present.  Musculoskeletal: No joint deformities, erythema, or stiffness, ROM full and nontender. Extremities: No swelling or edema,  pulses symmetric and intact bilaterally. No cyanosis or clubbing. Neurological: A&O x3, Strength is normal and symmetric bilaterally, cranial nerve II-XII are grossly intact, no focal motor deficit, sensory intact to light touch bilaterally.  Skin: Warm, dry and intact. No rashes or erythema. Psychiatric: Normal mood and affect. Speech and behavior is normal. Cognition and memory are normal.   Assessment & Plan:   Please see problem based assessment and plan.

## 2014-05-28 NOTE — Progress Notes (Signed)
Medical Nutrition Therapy:  Appt start time: 1430 end time:  1515.  Assessment:  Primary concerns today: Weight management and Meal planning.  Patient feels comfortable with his diabetes care, has a sister who is a Engineer, civil (consulting)nurse and is supportive. However, he does want assistance in weight loss. He wants to lose 30# in the next few years to help control his diabetes and overall health. He has lost 6 # in the past 3 months. A1C decreased to a 6.5% today . BMI is now 34.5 Learning Readiness: Change in progress Barriers to learning/adherence to lifestyle change: finances, competing values Usual eating pattern includes 3 meals and 2-3 snacks per day. This is new- patient reports he used to skip meals and not eat as consistently, but the diabetes medicine is helping him to eat at more regularly times because he has to take it with food. Stopped eating sweets, drinking juice and regular soda in past 3-4 months Frequent foods include water, chicken, oatmeal, fruit.  drinks beer once in a while. Avoided foods include sweets, and need to assess lactose tolerance and dairy intake at a future visit 24-hr recall: (Up at  4-6   AM) B (8 AM)- oatmeal with cranberries   Snk ( AM)-  banana L ( 12-2 PM)- Biggest meal- chicken, frozen vegetables, cornbread, water  Snk ( PM)- applesauce D (6-7 PM)- [place] sandwich and water  Usual physical activity includes walks 60 minutes at least 4 days a week . Progress Towards Goal(s):  In progress.   Nutritional Diagnosis:  NB-1.4 Self-monitoring deficit As related to lack of method to know how he is progressing with his goal of weight loss.  As evidenced by her report of not realizing he has lost weight and not having a scale.    Intervention:  Nutrition education about self monitoring and goal setting. Handouts given during visit include:  AVS Demonstrated degree of understanding via:  Teach Back   Monitoring/Evaluation:  Dietary intake, exercise, meter, and body weight in  3 week(s).

## 2014-05-28 NOTE — Assessment & Plan Note (Signed)
Lab Results  Component Value Date   HGBA1C 6.5 05/28/2014   HGBA1C 11.3 02/05/2014     Assessment: Diabetes control: good control (HgbA1C at goal) Progress toward A1C goal:  at goal Comments: Patient w/ complaints of dizziness, lightheadedness, diaphoresis, tremulousness, and palpitations after taking Glipizide. Because of these symptoms, patient has only been taking this medicine 1-2 times weekly. Checks his blood sugar 2 tomes daily, before breakfast and before lunch, claims his lowest recorded low is 80, highest high is 208, and says his average is 130-140. Currently taking Glipizide 10 mg qd and Metformin 1000 mg bid. HbA1c significantly improved from 11.3 to 6.5.  Plan: Medications:  Continue Metformin. DISCONTINUE Glipizide given reported symptoms of hypoglycemia.  Home glucose monitoring: Frequency: 2 times a day Timing: before breakfast;before lunch Instruction/counseling given: reminded to bring blood glucose meter & log to each visit Educational resources provided: brochure Self management tools provided: copy of home glucose meter download Other plans: Return to clinic in 3 months; do suspect HbA1c may increase by the next visit, at which time we can restart only low dose Glipizide (2.5), or consider other therapies as well.

## 2014-05-28 NOTE — Patient Instructions (Signed)
General Instructions:  1. Please schedule a follow up appointment for 3 months.   2. Please take all medications as prescribed. STOP taking Glipizide  3. If you have worsening of your symptoms or new symptoms arise, please call the clinic [phone]), or go to the ER immediately if symptoms are severe.  You have done a great job in taking all your medications. I appreciate it very much. Please continue doing that.   Thank you for bringing your medicines today. This helps us keep you safe from mistakes.   Progress Toward Treatment Goals:  Treatment Goal 05/28/2014  Hemoglobin A1C at goal  Blood pressure at goal    Self Care Goals & Plans:  Self Care Goal 05/28/2014  Manage my medications take my medicines as prescribed; bring my medications to every visit; refill my medications on time  Monitor my health -  Eat healthy foods drink diet soda or water instead of juice or soda; eat more vegetables; eat foods that are low in salt; eat baked foods instead of fried foods; eat fruit for snacks and desserts  Be physically active -  Meeting treatment goals maintain the current self-care plan    Home Blood Glucose Monitoring 05/28/2014  Check my blood sugar 2 times a day  When to check my blood sugar before breakfast; before lunch     Care Management & Community Referrals:  Referral 05/28/2014  Referrals made for care management support diabetes educator  Referrals made to community resources none

## 2014-05-28 NOTE — Progress Notes (Signed)
Patient ID: Craig Young, male   DOB: 1951-10-17, 63 y.o.   MRN: [id number]  Case discussed with Dr. Yetta BarreJones at the time of the visit.  We reviewed the resident's history and exam and pertinent patient test results.  I agree with the assessment, diagnosis, and plan of care documented in the resident's note.

## 2014-06-19 ENCOUNTER — Ambulatory Visit: Payer: Medicare Other | Admitting: Dietician

## 2014-06-19 ENCOUNTER — Encounter: Payer: Self-pay | Admitting: Internal Medicine

## 2014-06-25 ENCOUNTER — Other Ambulatory Visit: Payer: Medicare Other

## 2014-06-27 LAB — HEMOCCULT SLIDES (X 3 CARDS)

## 2014-07-16 ENCOUNTER — Encounter: Payer: Self-pay | Admitting: *Deleted

## 2014-07-24 NOTE — Addendum Note (Signed)
Addended by: Remus BlakeBARROW, Sharan Mcenaney K on: 07/24/2014 10:33 AM   Modules accepted: Orders

## 2014-08-29 ENCOUNTER — Encounter: Payer: Medicare Other | Admitting: Internal Medicine

## 2015-03-27 ENCOUNTER — Encounter: Payer: Self-pay | Admitting: Internal Medicine

## 2015-07-15 ENCOUNTER — Ambulatory Visit (INDEPENDENT_AMBULATORY_CARE_PROVIDER_SITE_OTHER): Payer: Medicare Other | Admitting: Internal Medicine

## 2015-07-15 VITALS — BP 134/75 | HR 70 | Temp 98.5°F | Ht 72.0 in | Wt 243.9 lb

## 2015-07-15 DIAGNOSIS — I10 Essential (primary) hypertension: Secondary | ICD-10-CM | POA: Diagnosis not present

## 2015-07-15 DIAGNOSIS — E1165 Type 2 diabetes mellitus with hyperglycemia: Secondary | ICD-10-CM | POA: Diagnosis not present

## 2015-07-15 DIAGNOSIS — IMO0002 Reserved for concepts with insufficient information to code with codable children: Secondary | ICD-10-CM

## 2015-07-15 DIAGNOSIS — E785 Hyperlipidemia, unspecified: Secondary | ICD-10-CM | POA: Diagnosis not present

## 2015-07-15 DIAGNOSIS — Z0000 Encounter for general adult medical examination without abnormal findings: Secondary | ICD-10-CM

## 2015-07-15 DIAGNOSIS — E1140 Type 2 diabetes mellitus with diabetic neuropathy, unspecified: Secondary | ICD-10-CM

## 2015-07-15 LAB — POCT GLYCOSYLATED HEMOGLOBIN (HGB A1C)

## 2015-07-15 LAB — GLUCOSE, CAPILLARY
GLUCOSE-CAPILLARY: 559 mg/dL — AB (ref 65–99)
Glucose-Capillary: 462 mg/dL — ABNORMAL HIGH (ref 65–99)

## 2015-07-15 MED ORDER — INSULIN ASPART 100 UNIT/ML ~~LOC~~ SOLN
5.0000 [IU] | Freq: Once | SUBCUTANEOUS | Status: AC
  Administered 2015-07-15: 5 [IU] via SUBCUTANEOUS

## 2015-07-15 MED ORDER — GLIPIZIDE 5 MG PO TABS: 5.0000 mg | ORAL_TABLET | Freq: Every day | ORAL | Status: DC

## 2015-07-15 MED ORDER — METFORMIN HCL 1000 MG PO TABS: 1000.0000 mg | ORAL_TABLET | Freq: Two times a day (BID) | ORAL | Status: DC

## 2015-07-15 MED ORDER — LOVASTATIN 20 MG PO TABS: 20.0000 mg | ORAL_TABLET | Freq: Every day | ORAL | Status: DC

## 2015-07-15 MED ORDER — LISINOPRIL 10 MG PO TABS: 10.0000 mg | ORAL_TABLET | Freq: Every day | ORAL | Status: DC

## 2015-07-15 NOTE — Patient Instructions (Signed)
1. Please make an appointment for 1-2 weeks.  2. Take the following medications:  Metformin 1000 mg twice daily (for diabetes)  Lisinopril 10 mg daily (for blood pressure)  Mevacor 20 mg daily (for cholesterol)  Start taking Glipizide 5 mg every morning. Make sure you take this with food because it can make your blood sugar low.   Next visit you will also see Norm Parcel, diabetes educator.   PLEASE CHECK YOUR BLOOD SUGAR 2 TIMES DAILY.   3. If you have worsening of your symptoms or new symptoms arise, please call the clinic ([phone]), or go to the ER immediately if symptoms are severe.

## 2015-07-15 NOTE — Progress Notes (Signed)
   Subjective:   Patient ID: Xaiver AUSTIN HERD male   DOB: 08/15/1951 64 y.o.   MRN: [id number]  HPI: Mr. KEYSHAWN HELLWIG is a 64 y.o. y/o male w/ PMHx of HTN, HLD, and newly diagnosed DM type II, presents to the clinic today for a follow-up visit regarding his DM. Patient has stopped taking all of his medications since his last visit which was roughly one year ago. He says he sometimes will take Metformin when he notices his vision starts to seem blurry, but otherwise says he has had some personal issues that have interfered with him taking his medications. CBG is 500's today. He is completely asymptomatic however, denies any changes in his vision currently, no nausea, vomiting, or abdominal pain. He states he has never had those symptoms associated w/ hyperglycemia. He does admit to drinking 2 sodas before he came to the clinic as well.   Asked specifically about mood changes, depression, sadness, he states he feels fine, just says he has let his health go recently. Can't say why exactly.   Current Outpatient Prescriptions  Medication Sig Dispense Refill  . Blood Glucose Monitoring Suppl (ONETOUCH VERIO IQ SYSTEM) W/DEVICE KIT Check blood sugar once daily as instructed 1 kit 0  . glipiZIDE (GLUCOTROL) 5 MG tablet Take 1 tablet (5 mg total) by mouth daily before breakfast. 30 tablet 2  . glucose blood (ONETOUCH VERIO) test strip Use as instructed to check blood sugar 3 times per day.  Dx code 250.00 100 each 12  . lisinopril (PRINIVIL,ZESTRIL) 10 MG tablet Take 1 tablet (10 mg total) by mouth daily. 30 tablet 5  . lovastatin (MEVACOR) 20 MG tablet Take 1 tablet (20 mg total) by mouth daily. 30 tablet 11  . metFORMIN (GLUCOPHAGE) 1000 MG tablet Take 1 tablet (1,000 mg total) by mouth 2 (two) times daily with a meal. 60 tablet 11   No current facility-administered medications for this visit.   Review of Systems  General: Denies fever, diaphoresis, appetite change, and fatigue.  Respiratory:  Denies SOB, cough, and wheezing.   Cardiovascular: Denies chest pain and palpitations.  Gastrointestinal: Denies nausea, vomiting, abdominal pain, and diarrhea Musculoskeletal: Denies myalgias, arthralgias, back pain, and gait problem.  Neurological: Denies dizziness, syncope, weakness, lightheadedness, and headaches.  Psychiatric/Behavioral: Denies mood changes, sleep disturbance, and agitation.   Objective:   Physical Exam: Filed Vitals:   07/15/15 1347  BP: 134/75  Pulse: 70  Temp: 98.5 F (36.9 C)  TempSrc: Oral  Height: 6' (1.829 m)  Weight: 243 lb 14.4 oz (110.632 kg)  SpO2: 99%    General: AA male, alert, cooperative, NAD. HEENT: PERRL, EOMI. Moist mucus membranes. Poor dentition.  Neck: Full range of motion without pain, supple, no lymphadenopathy or carotid bruits Lungs: Clear to ascultation bilaterally, normal work of respiration, no wheezes, rales, rhonchi Heart: RRR, no murmurs, gallops, or rubs Abdomen: Soft, non-tender, non-distended, BS + Extremities: No cyanosis, clubbing, or edema Neurologic: Alert & oriented x3, cranial nerves II-XII intact, strength grossly intact, sensation intact to light touch    Assessment & Plan:   Please see problem based assessment and plan.

## 2015-07-16 LAB — BMP8+ANION GAP
ANION GAP: 19 mmol/L — AB (ref 10.0–18.0)
BUN/Creatinine Ratio: 10 (ref 10–22)
BUN: 10 mg/dL (ref 8–27)
CO2: 19 mmol/L (ref 18–29)
CREATININE: 0.96 mg/dL (ref 0.76–1.27)
Calcium: 9.2 mg/dL (ref 8.6–10.2)
Chloride: 94 mmol/L — ABNORMAL LOW (ref 97–108)
GFR, EST AFRICAN AMERICAN: 95 mL/min/{1.73_m2} (ref >59)
GFR, EST NON AFRICAN AMERICAN: 82 mL/min/{1.73_m2} (ref >59)
Glucose: 498 mg/dL — ABNORMAL HIGH (ref 65–99)
Potassium: 4 mmol/L (ref 3.5–5.2)
SODIUM: 132 mmol/L — AB (ref 134–144)

## 2015-07-16 LAB — MICROALBUMIN / CREATININE URINE RATIO
Creatinine, Urine: 23.8 mg/dL
Microalbumin, Urine: <3 ug/mL

## 2015-07-16 NOTE — Assessment & Plan Note (Addendum)
Lab Results  Component Value Date   HGBA1C >14.0 07/15/2015   HGBA1C 6.5 05/28/2014   HGBA1C 11.3 02/05/2014     Assessment: Diabetes control:  Poorly controlled Progress toward A1C goal:   Significantly worsened Comments: Patient has not taken any of his medications for several months, according to the patient. He states he will take some Metformin on occasion when he feels his vision becomes blurry. CBG in the 500's today, says he is completely asymptomatic. No blurry vision today. HbA1c is now above 14. No nausea or abdominal pain. Checked BMP, HCO3 19, AG 19.   Plan: Medications:  Give 5 units Novolog today. CBG decreased into 400's during visit. Patient continues to feel asymptomatic. Restart Metformin 1000 mg bid. Will also add Glipizide 5 mg qAM. Had previously had hypoglycemia with this, however, given his severe increase in HbA1c, feel he can likely tolerate this medication at this time.  Home glucose monitoring: (did not bring his meter) Frequency:  BID Instruction/counseling given: reminded to bring blood glucose meter & log to each visit, reminded to bring medications to each visit, discussed foot care, discussed the need for weight loss and discussed diet Other plans: Spent much of this visit discussing the importance of taking his medication. Discussed possibility of DKA w/ severely elevated CBG's, as well as the chronic problems that can also result, including renal failure, PVD, infection, etc.  -RTC in 1-2 weeks. Patient understands to bring his meter and his medications. Refilled all of them today. Recheck BMP at that time. Patient will also meet with Norm Parcel at that time.  -Given severely elevated HbA1c, may require at least basal insulin at some point, however, will attempt to better control CBG's w/ oral medications at this time given poor compliance as result of worse control.   ADDENDUM: Attempted to call patient next day to check in, does not have a working phone  number listed in chart. Will attempt to call again later.

## 2015-07-16 NOTE — Assessment & Plan Note (Signed)
Restart Mevacor 20 mg daily.

## 2015-07-16 NOTE — Assessment & Plan Note (Signed)
BP Readings from Last 3 Encounters:  07/15/15 134/75  05/28/14 125/80  03/29/14 137/87    Lab Results  Component Value Date   NA 132* 07/15/2015   K 4.0 07/15/2015   CREATININE 0.96 07/15/2015    Assessment: Blood pressure control:  Controlled.  Comments: Patient has not been taking Lisinopril. Feel this is important given his poorly controlled DM, will allow for some renal protection.   Plan: Medications:  Restart Lisinopril 10 mg daily.  Other plans: Check BMP. Return to clinic in 1-2 weeks.

## 2015-07-16 NOTE — Assessment & Plan Note (Signed)
Flu shot given today. Urine Microalbumin/Cr checked as well.

## 2015-07-24 NOTE — Progress Notes (Signed)
Internal Medicine Clinic Attending  Case discussed with Dr. Jones soon after the resident saw the patient.  We reviewed the resident's history and exam and pertinent patient test results.  I agree with the assessment, diagnosis, and plan of care documented in the resident's note. 

## 2015-07-29 ENCOUNTER — Encounter: Payer: Medicare Other | Admitting: Internal Medicine

## 2015-07-29 ENCOUNTER — Encounter: Payer: Medicare Other | Admitting: Dietician

## 2015-08-05 ENCOUNTER — Ambulatory Visit (INDEPENDENT_AMBULATORY_CARE_PROVIDER_SITE_OTHER): Payer: Medicare Other | Admitting: Dietician

## 2015-08-05 ENCOUNTER — Encounter: Payer: Self-pay | Admitting: Internal Medicine

## 2015-08-05 ENCOUNTER — Ambulatory Visit (INDEPENDENT_AMBULATORY_CARE_PROVIDER_SITE_OTHER): Payer: Medicare Other | Admitting: Internal Medicine

## 2015-08-05 ENCOUNTER — Encounter: Payer: Self-pay | Admitting: Dietician

## 2015-08-05 VITALS — BP 144/68 | HR 62 | Temp 98.3°F | Wt 248.4 lb

## 2015-08-05 DIAGNOSIS — I10 Essential (primary) hypertension: Secondary | ICD-10-CM

## 2015-08-05 DIAGNOSIS — E1165 Type 2 diabetes mellitus with hyperglycemia: Secondary | ICD-10-CM | POA: Diagnosis not present

## 2015-08-05 DIAGNOSIS — Z0000 Encounter for general adult medical examination without abnormal findings: Secondary | ICD-10-CM

## 2015-08-05 DIAGNOSIS — IMO0002 Reserved for concepts with insufficient information to code with codable children: Secondary | ICD-10-CM

## 2015-08-05 DIAGNOSIS — Z7984 Long term (current) use of oral hypoglycemic drugs: Secondary | ICD-10-CM

## 2015-08-05 DIAGNOSIS — Z23 Encounter for immunization: Secondary | ICD-10-CM | POA: Diagnosis not present

## 2015-08-05 DIAGNOSIS — E1140 Type 2 diabetes mellitus with diabetic neuropathy, unspecified: Secondary | ICD-10-CM

## 2015-08-05 DIAGNOSIS — Z713 Dietary counseling and surveillance: Secondary | ICD-10-CM

## 2015-08-05 DIAGNOSIS — E119 Type 2 diabetes mellitus without complications: Secondary | ICD-10-CM

## 2015-08-05 LAB — HM DIABETES EYE EXAM

## 2015-08-05 LAB — GLUCOSE, CAPILLARY: GLUCOSE-CAPILLARY: 136 mg/dL — AB (ref 65–99)

## 2015-08-05 NOTE — Progress Notes (Signed)
   Subjective:   Patient ID: Craig Young male   DOB: 07-19-1951 64 y.o.   MRN: [id number]  HPI: Craig Young is a 64 y.o. y/o male w/ PMHx of HTN, HLD, and newly diagnosed DM type II, presents to the clinic today for a follow-up visit regarding his DM. Patient was last seen on 07/15/15 after he had previously been lost to follow up. Most recent HbA1c was >14 as he had not been taking any of his medications. Since that visit, patient was restarted on Metformin 1000 mg bid + Glipizide 5 mg qAM and has been taking these medications every day. His CBG's at home have been between 100-200 in the AM, 100-180 in PM. He denies any symptoms of hypoglycemia. He also admits that he has increased his activity level and has noticed that the more he walks, the better his blood sugar is controlled. No other issues.   Current Outpatient Prescriptions  Medication Sig Dispense Refill  . Blood Glucose Monitoring Suppl (ONETOUCH VERIO IQ SYSTEM) W/DEVICE KIT Check blood sugar once daily as instructed 1 kit 0  . glipiZIDE (GLUCOTROL) 5 MG tablet Take 1 tablet (5 mg total) by mouth daily before breakfast. 30 tablet 2  . glucose blood (ONETOUCH VERIO) test strip Use as instructed to check blood sugar 3 times per day.  Dx code 250.00 100 each 12  . lisinopril (PRINIVIL,ZESTRIL) 10 MG tablet Take 1 tablet (10 mg total) by mouth daily. 30 tablet 5  . lovastatin (MEVACOR) 20 MG tablet Take 1 tablet (20 mg total) by mouth daily. 30 tablet 11  . metFORMIN (GLUCOPHAGE) 1000 MG tablet Take 1 tablet (1,000 mg total) by mouth 2 (two) times daily with a meal. 60 tablet 11   No current facility-administered medications for this visit.   Review of Systems  General: Denies fever, diaphoresis, appetite change, and fatigue.  Respiratory: Denies SOB, cough, and wheezing.   Cardiovascular: Denies chest pain and palpitations.  Gastrointestinal: Denies nausea, vomiting, abdominal pain, and diarrhea Musculoskeletal: Denies  myalgias, arthralgias, back pain, and gait problem.  Neurological: Denies dizziness, syncope, weakness, lightheadedness, and headaches.  Psychiatric/Behavioral: Denies mood changes, sleep disturbance, and agitation.   Objective:   Physical Exam: Filed Vitals:   08/05/15 0845  BP: 144/68  Pulse: 62  Temp: 98.3 F (36.8 C)  TempSrc: Oral  Weight: 248 lb 6.4 oz (112.674 kg)  SpO2: 99%    General: AA male, alert, cooperative, NAD. HEENT: PERRL, EOMI. Moist mucus membranes. Poor dentition.  Neck: Full range of motion without pain, supple, no lymphadenopathy or carotid bruits Lungs: Clear to ascultation bilaterally, normal work of respiration, no wheezes, rales, rhonchi Heart: RRR, no murmurs, gallops, or rubs Abdomen: Soft, non-tender, non-distended, BS + Extremities: No cyanosis, clubbing, or edema Neurologic: Alert & oriented x3, cranial nerves II-XII intact, strength grossly intact, sensation intact to light touch    Assessment & Plan:   Please see problem based assessment and plan.

## 2015-08-05 NOTE — Progress Notes (Signed)
Medical Nutrition Therapy:  Appt start time: 1000 end time:  1040. Follow Up appointment Initial was 05/28/2014 over 1 year ago  Assessment:  Primary concerns today: learning how to better control his blood sugars with diet.  Patient reports his eating habits are better now then they have been in the past. He used to eat 1-2 meals a day and now he always eats 3. He also eats more vegetables, beans and fruits and less meat. CDE explained diabetes self management and offered classes [facility], but patient was not interested in attending them at this time. Patient knowledgeable about food sources of carbohydrate- starch and sugar, protein and fat with some misconceptions.  He reports knowledge of hyperglycemia symptoms and hypoglycemia symptoms, but lacks training fo how to prevent hypoglycemia by coordination of food, medicine and activity.   Preferred Learning Style: Auditory and Visual,likes to read, does not prefer video or computer Learning Readiness: Ready and Change in progress   MEDICATIONS: metformin- has to take it with food to decrease stomach upset BLOOD SUGAR: he reports improving but not where he wants it to be. 100-180 per doctor's note.  DIETARY INTAKE: Usual eating pattern includes 3  meals and 0-1 snacks per day. Everyday foods include beans, fruit, oatmeal.  Avoided foods include milk.   24-hr recall:  B (  6 AM): homemade oatmeal with cranberries or raisins  Walks sometimes for 2 hours, tries not to eat for a stretch of time here to help him lose weight L ( 1-2 PM): ?  D ( 5-6 PM): beans and rice and vegetables during the week, meat on weekends Snk ( PM): tries to not eat after 6 PM Beverages: water, no milk- is lactose intolerant, soda, but stopped recently  Usual physical activity: walks at least 4 days a week for 1-2 hours each time, he is interested in silver sneakers and using exercise to control his diabetes  Estimated daily energy needs: 2200 calories 280-200 g  carbohydrates   Progress Towards3 Goal(s):  In progress.   Nutritional Diagnosis:  NB-1.1 Food and nutrition-related knowledge deficit as related to lack of sufficient diabetes nutrition training as evidenced by his questions and prolonged time between meals and during exercise   .  NB-1.4 Self-monitoring deficit As related to not having a  scale to monitor his weight loss as per his goal last year is deleted as this goal is not appropriate at this time    Intervention:  Nutrition education about carb consistent diet, need to fuel body throughout the day especially before activity and/or snack with unplanned or prolonged activity. Educated about action of glipizide and what type 2 diabetes is.  Coordintation of care- patient may do better with shorter acting oral hypoglycemic agent  like prandin to cover highs in PM or days when he doesn't exercise. Need to review blood sugar pattern to determine this.  Teaching Method Utilized: Visual, Auditory Handouts given during visit include: AVS, exercise information Barriers to learning/adherence to lifestyle change:lack of support, competing values Demonstrated degree of understanding via:  Teach Back   Monitoring/Evaluation:  Dietary intake, exercise, meter, and body weight in 2 month(s).

## 2015-08-05 NOTE — Assessment & Plan Note (Signed)
Foot exam, Pneumonia vax today

## 2015-08-05 NOTE — Assessment & Plan Note (Signed)
BP Readings from Last 3 Encounters:  08/05/15 144/68  07/15/15 134/75  05/28/14 125/80    Lab Results  Component Value Date   NA 132* 07/15/2015   K 4.0 07/15/2015   CREATININE 0.96 07/15/2015    Assessment: Blood pressure control:  Slightly elevated.  Comments: Taking Lisinopril 10 mg daily.   Plan: Medications:  continue current medications. If BP continues to be elevated, can increase to 20 mg daily at next clinic visit.  Other plans: RTC in 2 months

## 2015-08-05 NOTE — Patient Instructions (Signed)
1. Please make follow up appointment for 2 months.  2. Please take all medications as previously prescribed.  3. If you have worsening of your symptoms or new symptoms arise, please call the clinic ([phone]), or go to the ER immediately if symptoms are severe.  You have done a great job in taking all your medications. Please continue to do this.

## 2015-08-05 NOTE — Assessment & Plan Note (Signed)
Lab Results  Component Value Date   HGBA1C >14.0 07/15/2015   HGBA1C 6.5 05/28/2014   HGBA1C 11.3 02/05/2014     Assessment: Diabetes control:  Improved CBG's since last visit Comments: has restarted Metformin 1000 mg bid w/ recent addition of Glipizide 5 mg qAM. Significantly improved blood sugars. Average 160. No hypoglycemia.   Plan: Medications:  continue current medications Home glucose monitoring: Frequency:  bid Instruction/counseling given: reminded to bring blood glucose meter & log to each visit, reminded to bring medications to each visit, discussed foot care and discussed diet Other plans: Return in 2 months for repeat HbA1c. Patient is interested in controlling his DM and medication compliance.  -Appointment w/ Norm Parcelonna Plyler, DM educator.

## 2015-08-07 NOTE — Progress Notes (Signed)
Internal Medicine Clinic Attending  Case discussed with Dr. Jones at the time of the visit.  We reviewed the resident's history and exam and pertinent patient test results.  I agree with the assessment, diagnosis, and plan of care documented in the resident's note.  

## 2015-08-12 ENCOUNTER — Encounter: Payer: Self-pay | Admitting: Dietician

## 2015-08-29 ENCOUNTER — Encounter: Payer: Self-pay | Admitting: Dietician

## 2015-09-29 ENCOUNTER — Ambulatory Visit: Payer: Medicare Other | Admitting: Internal Medicine

## 2015-09-29 ENCOUNTER — Encounter: Payer: Self-pay | Admitting: Internal Medicine

## 2015-09-29 ENCOUNTER — Encounter: Payer: Medicare Other | Admitting: Dietician

## 2015-11-12 ENCOUNTER — Encounter: Payer: Self-pay | Admitting: Internal Medicine

## 2015-11-27 ENCOUNTER — Telehealth: Payer: Self-pay | Admitting: Internal Medicine

## 2015-11-27 NOTE — Telephone Encounter (Signed)
APPT REMINDER CALL/ NO ANSWER/PHONE OFF

## 2015-11-28 ENCOUNTER — Encounter: Payer: Self-pay | Admitting: Internal Medicine

## 2015-11-28 ENCOUNTER — Ambulatory Visit: Payer: Medicare Other | Admitting: Internal Medicine

## 2016-01-09 ENCOUNTER — Encounter: Payer: Self-pay | Admitting: Internal Medicine

## 2016-01-09 ENCOUNTER — Ambulatory Visit (INDEPENDENT_AMBULATORY_CARE_PROVIDER_SITE_OTHER): Payer: Medicare Other | Admitting: Internal Medicine

## 2016-01-09 VITALS — BP 167/76 | HR 66 | Temp 97.8°F | Ht 72.0 in | Wt 265.9 lb

## 2016-01-09 DIAGNOSIS — Z7984 Long term (current) use of oral hypoglycemic drugs: Secondary | ICD-10-CM | POA: Diagnosis not present

## 2016-01-09 DIAGNOSIS — IMO0002 Reserved for concepts with insufficient information to code with codable children: Secondary | ICD-10-CM

## 2016-01-09 DIAGNOSIS — E785 Hyperlipidemia, unspecified: Secondary | ICD-10-CM | POA: Diagnosis not present

## 2016-01-09 DIAGNOSIS — I10 Essential (primary) hypertension: Secondary | ICD-10-CM

## 2016-01-09 DIAGNOSIS — Z0000 Encounter for general adult medical examination without abnormal findings: Secondary | ICD-10-CM

## 2016-01-09 DIAGNOSIS — E1140 Type 2 diabetes mellitus with diabetic neuropathy, unspecified: Secondary | ICD-10-CM | POA: Diagnosis not present

## 2016-01-09 DIAGNOSIS — Z79899 Other long term (current) drug therapy: Secondary | ICD-10-CM

## 2016-01-09 DIAGNOSIS — E1165 Type 2 diabetes mellitus with hyperglycemia: Secondary | ICD-10-CM | POA: Diagnosis not present

## 2016-01-09 DIAGNOSIS — Z1211 Encounter for screening for malignant neoplasm of colon: Secondary | ICD-10-CM | POA: Insufficient documentation

## 2016-01-09 LAB — GLUCOSE, CAPILLARY: GLUCOSE-CAPILLARY: 113 mg/dL — AB (ref 65–99)

## 2016-01-09 LAB — POCT GLYCOSYLATED HEMOGLOBIN (HGB A1C): Hemoglobin A1C: 6.6

## 2016-01-09 MED ORDER — LOVASTATIN 20 MG PO TABS: 20.0000 mg | ORAL_TABLET | Freq: Every day | ORAL | Status: DC

## 2016-01-09 MED ORDER — LISINOPRIL 20 MG PO TABS: 20.0000 mg | ORAL_TABLET | Freq: Every day | ORAL | Status: DC

## 2016-01-09 NOTE — Assessment & Plan Note (Signed)
Referral sent to GI for screening colonoscopy

## 2016-01-09 NOTE — Patient Instructions (Signed)
1. Please make a follow up appointment for 3 months.   2. Please take all medications as previously prescribed with the following changes:  Increase Lisinopril to 20 mg daily. I have sent a new prescription to your pharmacy.   Continue to take your other medications as previously prescribed. This is very important.   I have also placed a referral for gastroenterology for a colonoscopy.   3. If you have worsening of your symptoms or new symptoms arise, please call the clinic ([phone] or go to the ER immediately if symptoms are severe.  You have done a great job in taking all your medications. Please continue to do this.

## 2016-01-09 NOTE — Progress Notes (Signed)
   Subjective:   Patient ID: Craig Young male   DOB: Jun 13, 1951 65 y.o.   MRN: [id number]  HPI: Craig Young is a 65 y.o. y/o male w/ PMHx of HTN, HLD, and DM type II, presents to the clinic today for a follow-up visit regarding his DM. Last HbA1c was >14.0 2/2 non-compliance with his Metformin. Since that time, he has been very diligent about taking his medications and monitoring his blood sugar. His average blood sugar has been in the 110's fasting. No hypoglycemia or symptoms to suggest this. Has been out of his blood pressure medications for the past 2 weeks. No further complaints. Patient has gained weight since his last visit, mostly related to some mild dietary indiscretion and lack of exercise.      Current Outpatient Prescriptions  Medication Sig Dispense Refill  . Blood Glucose Monitoring Suppl (ONETOUCH VERIO IQ SYSTEM) W/DEVICE KIT Check blood sugar once daily as instructed 1 kit 0  . glipiZIDE (GLUCOTROL) 5 MG tablet Take 1 tablet (5 mg total) by mouth daily before breakfast. 30 tablet 2  . glucose blood (ONETOUCH VERIO) test strip Use as instructed to check blood sugar 3 times per day.  Dx code 250.00 100 each 12  . lisinopril (PRINIVIL,ZESTRIL) 10 MG tablet Take 1 tablet (10 mg total) by mouth daily. 30 tablet 5  . lovastatin (MEVACOR) 20 MG tablet Take 1 tablet (20 mg total) by mouth daily. 30 tablet 11  . metFORMIN (GLUCOPHAGE) 1000 MG tablet Take 1 tablet (1,000 mg total) by mouth 2 (two) times daily with a meal. 60 tablet 11   No current facility-administered medications for this visit.   Review of Systems  General: Positive for weight gain. Denies fever, diaphoresis, appetite change, and fatigue.  Respiratory: Denies SOB, cough, and wheezing.   Cardiovascular: Denies chest pain and palpitations.  Gastrointestinal: Denies nausea, vomiting, abdominal pain, and diarrhea Musculoskeletal: Denies myalgias, arthralgias, back pain, and gait problem.  Neurological:  Denies dizziness, syncope, weakness, lightheadedness, and headaches.  Psychiatric/Behavioral: Denies mood changes, sleep disturbance, and agitation.   Objective:   Physical Exam: Filed Vitals:   01/09/16 1044  BP: 167/76  Pulse: 66  Temp: 97.8 F (36.6 C)  TempSrc: Oral  Height: 6' (1.829 m)  Weight: 265 lb 14.4 oz (120.611 kg)  SpO2: 100%    General: AA male, alert, cooperative, NAD. HEENT: PERRL, EOMI. Moist mucus membranes. Poor dentition.  Neck: Full range of motion without pain, supple, no lymphadenopathy or carotid bruits Lungs: Clear to ascultation bilaterally, normal work of respiration, no wheezes, rales, rhonchi Heart: RRR, no murmurs, gallops, or rubs Abdomen: Soft, non-tender, non-distended, BS + Extremities: No cyanosis, clubbing, or edema Neurologic: Alert & oriented x3, cranial nerves II-XII intact, strength grossly intact, sensation intact to light touch    Assessment & Plan:   Please see problem based assessment and plan.

## 2016-01-09 NOTE — Progress Notes (Signed)
Internal Medicine Clinic Attending  Case discussed with Dr. Jones at the time of the visit.  We reviewed the resident's history and exam and pertinent patient test results.  I agree with the assessment, diagnosis, and plan of care documented in the resident's note.  

## 2016-01-09 NOTE — Assessment & Plan Note (Signed)
Referral for screening colonoscopy

## 2016-01-09 NOTE — Assessment & Plan Note (Signed)
Refill Mevacor. Check Lipids at next clinic visit.

## 2016-01-09 NOTE — Assessment & Plan Note (Signed)
Lab Results  Component Value Date   HGBA1C 6.6 01/09/2016   HGBA1C >14.0 07/15/2015   HGBA1C 6.5 05/28/2014     Assessment: Diabetes control:  Well controlled Progress toward A1C goal:   At goal Comments: patient has been very good about taking his medications since his last visit. Taking Metformin 1000 mg bid + Glipizide 5 mg qAM. He states he usually doesn't take his Glipizide however, unless his blood sugar is over 150. Patient has gained weight. Discussed importance of weight loss in relation to his diabetes control.   Plan: Medications:  Continue current medications Home glucose monitoring: Frequency:   BID Instruction/counseling given: reminded to bring blood glucose meter & log to each visit, reminded to bring medications to each visit, discussed foot care, discussed the need for weight loss and discussed diet Educational resources provided: brochure (denies) Self management tools provided: home glucose logbook Other plans: RTC in 3 months. No changes to medications at this time.

## 2016-01-09 NOTE — Assessment & Plan Note (Signed)
BP Readings from Last 3 Encounters:  01/09/16 167/76  08/05/15 144/68  07/15/15 134/75    Lab Results  Component Value Date   NA 132* 07/15/2015   K 4.0 07/15/2015   CREATININE 0.96 07/15/2015    Assessment: Blood pressure control:  Elevated Progress toward BP goal:   Not at goal Comments: BP elevated today, although patient has been without his Lisinopril for 2 weeks. Previous BP values were also mildly elevated, think his BP can tolerate increased dose of ACEI, this will also benefit his kidneys.   Plan: Medications:  Increase Lisinopril to 20 mg daily.  Other plans: RTC in 3 months.

## 2016-03-29 ENCOUNTER — Encounter: Payer: Self-pay | Admitting: *Deleted

## 2016-04-06 ENCOUNTER — Ambulatory Visit (INDEPENDENT_AMBULATORY_CARE_PROVIDER_SITE_OTHER): Payer: Medicare Other | Admitting: Internal Medicine

## 2016-04-06 ENCOUNTER — Encounter: Payer: Self-pay | Admitting: Internal Medicine

## 2016-04-06 VITALS — BP 164/80 | HR 63 | Temp 97.6°F | Ht 72.0 in | Wt 266.7 lb

## 2016-04-06 DIAGNOSIS — E1140 Type 2 diabetes mellitus with diabetic neuropathy, unspecified: Secondary | ICD-10-CM

## 2016-04-06 DIAGNOSIS — IMO0002 Reserved for concepts with insufficient information to code with codable children: Secondary | ICD-10-CM

## 2016-04-06 DIAGNOSIS — Z7984 Long term (current) use of oral hypoglycemic drugs: Secondary | ICD-10-CM

## 2016-04-06 DIAGNOSIS — Z79899 Other long term (current) drug therapy: Secondary | ICD-10-CM

## 2016-04-06 DIAGNOSIS — I10 Essential (primary) hypertension: Secondary | ICD-10-CM | POA: Diagnosis not present

## 2016-04-06 DIAGNOSIS — E785 Hyperlipidemia, unspecified: Secondary | ICD-10-CM | POA: Diagnosis not present

## 2016-04-06 DIAGNOSIS — E1165 Type 2 diabetes mellitus with hyperglycemia: Principal | ICD-10-CM

## 2016-04-06 LAB — GLUCOSE, CAPILLARY: Glucose-Capillary: 92 mg/dL (ref 65–99)

## 2016-04-06 LAB — POCT GLYCOSYLATED HEMOGLOBIN (HGB A1C): Hemoglobin A1C: 7.2

## 2016-04-06 MED ORDER — HYDROCHLOROTHIAZIDE 12.5 MG PO CAPS: 12.5000 mg | ORAL_CAPSULE | Freq: Every day | ORAL | Status: DC

## 2016-04-06 NOTE — Patient Instructions (Signed)
1. Please return in 4-6 weeks for blood pressure recheck.   2. Please take all medications as previously prescribed with the following changes:  Please start taking HCTZ 12.5 mg daily for your blood pressure.   3. If you have worsening of your symptoms or new symptoms arise, please call the clinic [phone]), or go to the ER immediately if symptoms are severe.  Hypertension Hypertension, commonly called high blood pressure, is when the force of blood pumping through your arteries is too strong. Your arteries are the blood vessels that carry blood from your heart throughout your body. A blood pressure reading consists of a higher number over a lower number, such as 110/72. The higher number (systolic) is the pressure inside your arteries when your heart pumps. The lower number (diastolic) is the pressure inside your arteries when your heart relaxes. Ideally you want your blood pressure below 120/80. Hypertension forces your heart to work harder to pump blood. Your arteries may become narrow or stiff. Having untreated or uncontrolled hypertension can cause heart attack, stroke, kidney disease, and other problems. RISK FACTORS Some risk factors for high blood pressure are controllable. Others are not.  Risk factors you cannot control include:   Race. You may be at higher risk if you are African American.  Age. Risk increases with age.  Gender. Men are at higher risk than women before age 67 years. After age 67, women are at higher risk than men. Risk factors you can control include:  Not getting enough exercise or physical activity.  Being overweight.  Getting too much fat, sugar, calories, or salt in your diet.  Drinking too much alcohol. SIGNS AND SYMPTOMS Hypertension does not usually cause signs or symptoms. Extremely high blood pressure (hypertensive crisis) may cause headache, anxiety, shortness of breath, and nosebleed. DIAGNOSIS To check if you have hypertension, your health care  provider will measure your blood pressure while you are seated, with your arm held at the level of your heart. It should be measured at least twice using the same arm. Certain conditions can cause a difference in blood pressure between your right and left arms. A blood pressure reading that is higher than normal on one occasion does not mean that you need treatment. If it is not clear whether you have high blood pressure, you may be asked to return on a different day to have your blood pressure checked again. Or, you may be asked to monitor your blood pressure at home for 1 or more weeks. TREATMENT Treating high blood pressure includes making lifestyle changes and possibly taking medicine. Living a healthy lifestyle can help lower high blood pressure. You may need to change some of your habits. Lifestyle changes may include:  Following the DASH diet. This diet is high in fruits, vegetables, and whole grains. It is low in salt, red meat, and added sugars.  Keep your sodium intake below 2,300 mg per day.  Getting at least 30-45 minutes of aerobic exercise at least 4 times per week.  Losing weight if necessary.  Not smoking.  Limiting alcoholic beverages.  Learning ways to reduce stress. Your health care provider may prescribe medicine if lifestyle changes are not enough to get your blood pressure under control, and if one of the following is true:  You are 7018-67 years of age and your systolic blood pressure is above 140.  You are 67 years of age or older, and your systolic blood pressure is above 150.  Your diastolic blood pressure is  above 90.  You have diabetes, and your systolic blood pressure is over 140 or your diastolic blood pressure is over 90.  You have kidney disease and your blood pressure is above 140/90.  You have heart disease and your blood pressure is above 140/90. Your personal target blood pressure may vary depending on your medical conditions, your age, and other  factors. HOME CARE INSTRUCTIONS  Have your blood pressure rechecked as directed by your health care provider.   Take medicines only as directed by your health care provider. Follow the directions carefully. Blood pressure medicines must be taken as prescribed. The medicine does not work as well when you skip doses. Skipping doses also puts you at risk for problems.  Do not smoke.   Monitor your blood pressure at home as directed by your health care provider. SEEK MEDICAL CARE IF:   You think you are having a reaction to medicines taken.  You have recurrent headaches or feel dizzy.  You have swelling in your ankles.  You have trouble with your vision. SEEK IMMEDIATE MEDICAL CARE IF:  You develop a severe headache or confusion.  You have unusual weakness, numbness, or feel faint.  You have severe chest or abdominal pain.  You vomit repeatedly.  You have trouble breathing. MAKE SURE YOU:   Understand these instructions.  Will watch your condition.  Will get help right away if you are not doing well or get worse.   This information is not intended to replace advice given to you by your health care provider. Make sure you discuss any questions you have with your health care provider.   Document Released: 10/04/2005 Document Revised: 02/18/2015 Document Reviewed: 07/27/2013 Elsevier Interactive Patient Education [employer]

## 2016-04-06 NOTE — Progress Notes (Signed)
   Subjective:   Patient ID: Denney ERSKIN ZINDA male   DOB: 27-Feb-1951 65 y.o.   MRN: [id number]  HPI: Mr. AZARYAH OLEKSY is a 65 y.o. y/o male w/ PMHx of HTN, HLD, and DM type II, presents to the clinic today for a follow-up visit regarding his DM and HTN. Patient is doing well, has been exercising much more, but says he is somewhat frustrated because he has not lost much weight. He has not been as careful about what he is eating it seems however. He has been very compliant with his medications, he says he only takes his glipizide when his CBG's are in the 200's however, because he says otherwise his sugars might get too low.   His blood pressure is elevated today.   He is scheduled for screening colonoscopy next week.      Current Outpatient Prescriptions  Medication Sig Dispense Refill  . Blood Glucose Monitoring Suppl (ONETOUCH VERIO IQ SYSTEM) W/DEVICE KIT Check blood sugar once daily as instructed 1 kit 0  . glipiZIDE (GLUCOTROL) 5 MG tablet Take 1 tablet (5 mg total) by mouth daily before breakfast. 30 tablet 2  . glucose blood (ONETOUCH VERIO) test strip Use as instructed to check blood sugar 3 times per day.  Dx code 250.00 100 each 12  . lisinopril (PRINIVIL,ZESTRIL) 20 MG tablet Take 1 tablet (20 mg total) by mouth daily. 30 tablet 5  . lovastatin (MEVACOR) 20 MG tablet Take 1 tablet (20 mg total) by mouth daily. 30 tablet 11  . metFORMIN (GLUCOPHAGE) 1000 MG tablet Take 1 tablet (1,000 mg total) by mouth 2 (two) times daily with a meal. 60 tablet 11   No current facility-administered medications for this visit.   Review of Systems  General: Denies fever, diaphoresis, appetite change, and fatigue.  Respiratory: Denies SOB, cough, and wheezing.   Cardiovascular: Denies chest pain and palpitations.  Gastrointestinal: Denies nausea, vomiting, abdominal pain, and diarrhea Musculoskeletal: Denies myalgias, arthralgias, back pain, and gait problem.  Neurological: Denies  dizziness, syncope, weakness, lightheadedness, and headaches.  Psychiatric/Behavioral: Denies mood changes, sleep disturbance, and agitation.    Objective:   Physical Exam: Filed Vitals:   04/06/16 1322  BP: 164/80  Pulse: 63  Temp: 97.6 F (36.4 C)  TempSrc: Oral  Height: 6' (1.829 m)  Weight: 266 lb 11.2 oz (120.974 kg)  SpO2: 100%    General: AA male, alert, cooperative, NAD. HEENT: PERRL, EOMI. Moist mucus membranes. Poor dentition.  Neck: Full range of motion without pain, supple, no lymphadenopathy or carotid bruits Lungs: Clear to ascultation bilaterally, normal work of respiration, no wheezes, rales, rhonchi Heart: RRR, no murmurs, gallops, or rubs Abdomen: Soft, non-tender, non-distended, BS + Extremities: No cyanosis, clubbing, or edema Neurologic: Alert & oriented x3, cranial nerves II-XII intact, strength grossly intact, sensation intact to light touch    Assessment & Plan:   Please see problem based assessment and plan.

## 2016-04-07 LAB — BMP8+ANION GAP
ANION GAP: 16 mmol/L (ref 10.0–18.0)
BUN/Creatinine Ratio: 9 — ABNORMAL LOW (ref 10–24)
BUN: 9 mg/dL (ref 8–27)
CO2: 22 mmol/L (ref 18–29)
CREATININE: 0.96 mg/dL (ref 0.76–1.27)
Calcium: 9.4 mg/dL (ref 8.6–10.2)
Chloride: 102 mmol/L (ref 96–106)
GFR, EST AFRICAN AMERICAN: 94 mL/min/{1.73_m2} (ref >59)
GFR, EST NON AFRICAN AMERICAN: 81 mL/min/{1.73_m2} (ref >59)
Glucose: 79 mg/dL (ref 65–99)
POTASSIUM: 4.2 mmol/L (ref 3.5–5.2)
SODIUM: 140 mmol/L (ref 134–144)

## 2016-04-07 LAB — LIPID PANEL
CHOL/HDL RATIO: 5.1 ratio — AB (ref 0.0–5.0)
Cholesterol, Total: 187 mg/dL (ref 100–199)
HDL: 37 mg/dL — ABNORMAL LOW (ref >39)
LDL CALC: 105 mg/dL — AB (ref 0–99)
TRIGLYCERIDES: 225 mg/dL — AB (ref 0–149)
VLDL Cholesterol Cal: 45 mg/dL — ABNORMAL HIGH (ref 5–40)

## 2016-04-07 NOTE — Assessment & Plan Note (Signed)
BP Readings from Last 3 Encounters:  04/06/16 164/80  01/09/16 167/76  08/05/15 144/68    Lab Results  Component Value Date   NA 140 04/06/2016   K 4.2 04/06/2016   CREATININE 0.96 04/06/2016    Assessment: Blood pressure control:  Elevated Progress toward BP goal:   Not at goal  Comments: Taking Lisinopril 20 mg daily, says he took it this AM and has been taking it consistently every day.   Plan: Medications:  Continue Lisinopril 20 mg daily. Add HCTZ 12.5 mg daily.  Other plans: Good renal function, no electrolyte abnormalities. RTC in 4 weeks for follow up BP

## 2016-04-07 NOTE — Assessment & Plan Note (Signed)
Lab Results  Component Value Date   HGBA1C 7.2 04/06/2016   HGBA1C 6.6 01/09/2016   HGBA1C >14.0 07/15/2015     Assessment: Diabetes control:  Well controlled Progress toward A1C goal:  at goal Comments: Taking Metformin 1000 mg bid. Takes Glipizide only when his blood sugar is >200.   Plan: Medications:  continue current medications. Encouraged patient to take Glipizide if his BP is > 180. Discussed dietary modifications at length today.  Home glucose monitoring: Frequency:  bid Instruction/counseling given: reminded to bring blood glucose meter & log to each visit, reminded to bring medications to each visit, discussed foot care, discussed the need for weight loss and discussed diet Other plans: RTC in 4-6 weeks.

## 2016-04-07 NOTE — Assessment & Plan Note (Signed)
- 

## 2016-04-09 NOTE — Progress Notes (Signed)
Internal Medicine Clinic Attending  Case discussed with Dr. Jones at the time of the visit.  We reviewed the resident's history and exam and pertinent patient test results.  I agree with the assessment, diagnosis, and plan of care documented in the resident's note.  

## 2016-05-19 ENCOUNTER — Other Ambulatory Visit: Payer: Self-pay

## 2016-05-19 DIAGNOSIS — Z794 Long term (current) use of insulin: Principal | ICD-10-CM

## 2016-05-19 DIAGNOSIS — E1140 Type 2 diabetes mellitus with diabetic neuropathy, unspecified: Secondary | ICD-10-CM

## 2016-05-19 MED ORDER — GLUCOSE BLOOD VI STRP: ORAL_STRIP | 6 refills | Status: DC

## 2016-05-19 NOTE — Telephone Encounter (Signed)
Sarah from [facility] requesting one touch ultra test strips to be filled.

## 2016-12-04 NOTE — Assessment & Plan Note (Addendum)
Lab Results  Component Value Date   HGBA1C 7.7 12/06/2016   HGBA1C 7.2 04/06/2016   HGBA1C 6.6 01/09/2016    Recent Labs  12/06/16 1339  GLUCAP 169*    Current medications: metformin 1000 mg BID, glipizide 5 mg QAM for high CBGs >200 (less than once per week)  Current insulin: none  Home AM fasting and QHS CBGs mostly 150s-170s.  One symptomatic hypoglycemic event in 70s ~6 months ago after taking glipizide.  Assessment HgbA1c goal: <7.0 Glycemic control: above goal, slowly worsening in parallel with weight gain Complications: polyneuropathy  Plan Medications: continue metformin 1000 mg BID, add glipizide 2.5 mg daily Insulin: none Other: -discussed importance of weight loss for glycemic control -reports home DR screening 08/2016

## 2016-12-04 NOTE — Progress Notes (Signed)
   CC: "I get up 4 or 5 times at night to pee."  HPI:  [name] is a 66 y.o. man with history of HTN, DM, and HL who presents for management of diabetes.  Please see A&P for status of the patient's chronic medical conditions.  For exercise, we reports walking 3-4x per week, 45-60 min.  He says he has been a sweaty person for at least 10 years.  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  No palpitations, weight loss, anxiety.  Symptoms don't bother him much.  Has has tremor in his left more than right hand for past 10 years, which he attributes to prior heavy alcohol use.  He now drinks 2-3 40z 1-2 days per month after he gets his SS check.  Past Medical History:  Diagnosis Date  . HTN (hypertension), benign   . Hyperlipidemia   . Type 2 diabetes, uncontrolled, with neuropathy (HCC)     Review of Systems:   Review of Systems  Constitutional: Positive for diaphoresis. Negative for chills and fever.  Respiratory: Negative for cough and shortness of breath.   Cardiovascular: Negative for chest pain and palpitations.  Genitourinary: Positive for frequency. Negative for dysuria, hematuria and urgency.  Neurological: Positive for tremors. Negative for focal weakness.   Physical Exam:  Vitals:   12/06/16 1324  BP: (!) 144/75  Pulse: 73  Temp: 98.1 F (36.7 C)  TempSrc: Oral  SpO2: 100%  Weight: 274 lb 9.6 oz (124.6 kg)  Height: 6' (1.829 m)   Weight  03/2016 266 lbs 07/2015 248 lbs   Physical Exam  Constitutional: He is oriented to person, place, and time.  Obese, in no distress  Eyes: Conjunctivae are normal. Pupils are equal, round, and reactive to light.  Cardiovascular: Normal rate, regular rhythm and normal heart sounds.   DP pulses brisk and symmetric bilaterally  Pulmonary/Chest: Effort normal and breath sounds normal. No respiratory distress.  Genitourinary:  Genitourinary Comments: Prostate diffusely enlarged, non-tender, no  palpable nodules  Musculoskeletal: He exhibits no edema or tenderness.  Neurological: He is alert and oriented to person, place, and time.  Postural > action tremor of left hand Finger to nose without ataxia bilaterally No delay in rising from seated position Casual gait intact  Skin: Skin is warm and dry.  Psychiatric: He has a normal mood and affect. His behavior is normal.    Assessment & Plan:   See Encounters Tab for problem based charting.  Patient discussed with Dr. Josem KaufmannKlima

## 2016-12-04 NOTE — Assessment & Plan Note (Addendum)
Lipid Panel     Component Value Date/Time   CHOL 187 04/06/2016 1354   TRIG 225 (H) 04/06/2016 1354   HDL 37 (L) 04/06/2016 1354   CHOLHDL 5.1 (H) 04/06/2016 1354   CHOLHDL 6.8 02/05/2014 1138   VLDL 71 (H) 02/05/2014 1138   LDLCALC 105 (H) 04/06/2016 1354   Current medications: lovastatin 20 mg daily  Reports muscles cramps/aches on atorvastatin several years ago.  ASCVD: 39.6%  -start high intensity statin therapy with rosuvastatin 20 mg daily

## 2016-12-04 NOTE — Assessment & Plan Note (Addendum)
BP Readings from Last 3 Encounters:  12/06/16 (!) 144/75  04/06/16 (!) 164/80  01/09/16 (!) 167/76   Lab Results  Component Value Date   CREATININE 0.96 04/06/2016   Lab Results  Component Value Date   K 4.2 04/06/2016    Current medications: HCTZ 12.5 mg daily, lisinopril 20 mg daily  Ran out of both last week, no antihypertensives today.  Assessment BP goal: <140/90 BP control: uncontrolled  Plan Medications: continue current meds Other: -discussed diet, exercise, weight loss.

## 2016-12-06 ENCOUNTER — Encounter: Payer: Self-pay | Admitting: Internal Medicine

## 2016-12-06 ENCOUNTER — Encounter (INDEPENDENT_AMBULATORY_CARE_PROVIDER_SITE_OTHER): Payer: Self-pay

## 2016-12-06 ENCOUNTER — Ambulatory Visit (INDEPENDENT_AMBULATORY_CARE_PROVIDER_SITE_OTHER): Payer: Medicare Other | Admitting: Internal Medicine

## 2016-12-06 VITALS — BP 144/75 | HR 73 | Temp 98.1°F | Ht 72.0 in | Wt 274.6 lb

## 2016-12-06 DIAGNOSIS — N401 Enlarged prostate with lower urinary tract symptoms: Secondary | ICD-10-CM | POA: Diagnosis not present

## 2016-12-06 DIAGNOSIS — Z0000 Encounter for general adult medical examination without abnormal findings: Secondary | ICD-10-CM

## 2016-12-06 DIAGNOSIS — I10 Essential (primary) hypertension: Secondary | ICD-10-CM

## 2016-12-06 DIAGNOSIS — E1142 Type 2 diabetes mellitus with diabetic polyneuropathy: Secondary | ICD-10-CM | POA: Diagnosis not present

## 2016-12-06 DIAGNOSIS — E669 Obesity, unspecified: Secondary | ICD-10-CM | POA: Diagnosis not present

## 2016-12-06 DIAGNOSIS — Z7289 Other problems related to lifestyle: Secondary | ICD-10-CM | POA: Diagnosis not present

## 2016-12-06 DIAGNOSIS — Z23 Encounter for immunization: Secondary | ICD-10-CM

## 2016-12-06 DIAGNOSIS — E785 Hyperlipidemia, unspecified: Secondary | ICD-10-CM

## 2016-12-06 DIAGNOSIS — E1165 Type 2 diabetes mellitus with hyperglycemia: Secondary | ICD-10-CM

## 2016-12-06 DIAGNOSIS — Z79899 Other long term (current) drug therapy: Secondary | ICD-10-CM | POA: Diagnosis not present

## 2016-12-06 DIAGNOSIS — Z7984 Long term (current) use of oral hypoglycemic drugs: Secondary | ICD-10-CM

## 2016-12-06 DIAGNOSIS — IMO0002 Reserved for concepts with insufficient information to code with codable children: Secondary | ICD-10-CM

## 2016-12-06 DIAGNOSIS — R351 Nocturia: Secondary | ICD-10-CM | POA: Diagnosis not present

## 2016-12-06 DIAGNOSIS — R251 Tremor, unspecified: Secondary | ICD-10-CM

## 2016-12-06 DIAGNOSIS — Z789 Other specified health status: Secondary | ICD-10-CM | POA: Insufficient documentation

## 2016-12-06 DIAGNOSIS — E1140 Type 2 diabetes mellitus with diabetic neuropathy, unspecified: Secondary | ICD-10-CM

## 2016-12-06 LAB — GLUCOSE, CAPILLARY: GLUCOSE-CAPILLARY: 169 mg/dL — AB (ref 65–99)

## 2016-12-06 LAB — POCT GLYCOSYLATED HEMOGLOBIN (HGB A1C): Hemoglobin A1C: 7.7

## 2016-12-06 MED ORDER — ROSUVASTATIN CALCIUM 20 MG PO TABS: 20.0000 mg | ORAL_TABLET | Freq: Every day | ORAL | 11 refills | Status: DC

## 2016-12-06 MED ORDER — METFORMIN HCL 1000 MG PO TABS: 1000.0000 mg | ORAL_TABLET | Freq: Two times a day (BID) | ORAL | 11 refills | Status: DC

## 2016-12-06 MED ORDER — GLIPIZIDE 5 MG PO TABS: 2.5000 mg | ORAL_TABLET | Freq: Every day | ORAL | 0 refills | Status: DC

## 2016-12-06 MED ORDER — LISINOPRIL 20 MG PO TABS: 20.0000 mg | ORAL_TABLET | Freq: Every day | ORAL | 5 refills | Status: DC

## 2016-12-06 MED ORDER — HYDROCHLOROTHIAZIDE 12.5 MG PO CAPS: 12.5000 mg | ORAL_CAPSULE | Freq: Every day | ORAL | 5 refills | Status: DC

## 2016-12-06 MED ORDER — TAMSULOSIN HCL 0.4 MG PO CAPS: 0.4000 mg | ORAL_CAPSULE | Freq: Every day | ORAL | 3 refills | Status: DC

## 2016-12-06 NOTE — Addendum Note (Signed)
Addended by: Angelina OkHERBIN, Burna Atlas F on: 12/06/2016 04:05 PM   Modules accepted: Orders

## 2016-12-06 NOTE — Progress Notes (Signed)
Case discussed with Dr. O'Sullivan at the time of the visit. We reviewed the resident's history and exam and pertinent patient test results. I agree with the assessment, diagnosis, and plan of care documented in the resident's note. 

## 2016-12-06 NOTE — Patient Instructions (Addendum)
Your diabetes is slowly getting worse, and I think it is because of you gaining weight.  Keep walking for exercise, and focus on eating moderate portions of healthy foods like fresh vegetables, whole grains, and lean meats.  Please review your new medication list as we've made a few changes.  If you have troubles affording any of your medicines, please call the clinic and let them know and I can find an alternative.  Diabetes Mellitus and Food It is important for you to manage your blood sugar (glucose) level. Your blood glucose level can be greatly affected by what you eat. Eating healthier foods in the appropriate amounts throughout the day at about the same time each day will help you control your blood glucose level. It can also help slow or prevent worsening of your diabetes mellitus. Healthy eating may even help you improve the level of your blood pressure and reach or maintain a healthy weight. General recommendations for healthful eating and cooking habits include:  Eating meals and snacks regularly. Avoid going long periods of time without eating to lose weight.  Eating a diet that consists mainly of plant-based foods, such as fruits, vegetables, nuts, legumes, and whole grains.  Using low-heat cooking methods, such as baking, instead of high-heat cooking methods, such as deep frying. Work with your dietitian to make sure you understand how to use the Nutrition Facts information on food labels. How can food affect me? Carbohydrates  Carbohydrates affect your blood glucose level more than any other type of food. Your dietitian will help you determine how many carbohydrates to eat at each meal and teach you how to count carbohydrates. Counting carbohydrates is important to keep your blood glucose at a healthy level, especially if you are using insulin or taking certain medicines for diabetes mellitus. Alcohol  Alcohol can cause sudden decreases in blood glucose (hypoglycemia), especially  if you use insulin or take certain medicines for diabetes mellitus. Hypoglycemia can be a life-threatening condition. Symptoms of hypoglycemia (sleepiness, dizziness, and disorientation) are similar to symptoms of having too much alcohol. If your health care provider has given you approval to drink alcohol, do so in moderation and use the following guidelines:  Women should not have more than one drink per day, and men should not have more than two drinks per day. One drink is equal to:  12 oz of beer.  5 oz of wine.  1 oz of hard liquor.  Do not drink on an empty stomach.  Keep yourself hydrated. Have water, diet soda, or unsweetened iced tea.  Regular soda, juice, and other mixers might contain a lot of carbohydrates and should be counted. What foods are not recommended? As you make food choices, it is important to remember that all foods are not the same. Some foods have fewer nutrients per serving than other foods, even though they might have the same number of calories or carbohydrates. It is difficult to get your body what it needs when you eat foods with fewer nutrients. Examples of foods that you should avoid that are high in calories and carbohydrates but low in nutrients include:  Trans fats (most processed foods list trans fats on the Nutrition Facts label).  Regular soda.  Juice.  Candy.  Sweets, such as cake, pie, doughnuts, and cookies.  Fried foods. What foods can I eat? Eat nutrient-rich foods, which will nourish your body and keep you healthy. The food you should eat also will depend on several factors, including:  The  calories you need.  The medicines you take.  Your weight.  Your blood glucose level.  Your blood pressure level.  Your cholesterol level. You should eat a variety of foods, including:  Protein.  Lean cuts of meat.  Proteins low in saturated fats, such as fish, egg whites, and beans. Avoid processed meats.  Fruits and  vegetables.  Fruits and vegetables that may help control blood glucose levels, such as apples, mangoes, and yams.  Dairy products.  Choose fat-free or low-fat dairy products, such as milk, yogurt, and cheese.  Grains, bread, pasta, and rice.  Choose whole grain products, such as multigrain bread, whole oats, and brown rice. These foods may help control blood pressure.  Fats.  Foods containing healthful fats, such as nuts, avocado, olive oil, canola oil, and fish. Does everyone with diabetes mellitus have the same meal plan? Because every person with diabetes mellitus is different, there is not one meal plan that works for everyone. It is very important that you meet with a dietitian who will help you create a meal plan that is just right for you. This information is not intended to replace advice given to you by your health care provider. Make sure you discuss any questions you have with your health care provider. Document Released: 07/01/2005 Document Revised: 03/11/2016 Document Reviewed: 08/31/2013 Elsevier Interactive Patient Education  2017 [employer]

## 2016-12-06 NOTE — Assessment & Plan Note (Signed)
Pneumovax and flu vaccine today [facility]

## 2016-12-06 NOTE — Assessment & Plan Note (Signed)
High risk alcohol use with binges of 4+ drinks in a day (2-3 40 oz beers) and history of prior alcohol dependence.  Drinks when he has money after receiving monthly SS check.  -Counseled to moderate drinking to maximum of 2-3 beers per day

## 2016-12-06 NOTE — Assessment & Plan Note (Signed)
Nocturia with 3-4 episodes nightly which interrupt his sleep and are very bothersome.  Also urinary hesitancy, feels his stream is lower pressure than it used to be.  Dribbling at the conclusion of micturation.  Symptoms have slowly worsened over last 10 years.  Prostate is smooth, non-tender, diffusely enlarged.  A/P BPH with LUTS -tamsulosin 0.4 mg daily

## 2016-12-06 NOTE — Assessment & Plan Note (Signed)
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  Reports it has been present and stable for at least 10 years, and thinks it may be related to prior heavy alcohol use.  No difficulty walking or initiating movements.  Asymmetric postural tremor without ataxia on exam.  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  Other possibilities include early PD, cerebellar tremor.  -continue to monitor

## 2017-03-17 NOTE — Assessment & Plan Note (Addendum)
Started on Flomax 3 months ago for BPH with symptoms of nocturia, urinary hesitancy, and dribbling.  Urinating 2x per night, down from 2-5x.  On HCTZ for HTN.  Current medications: tamsulosin 0.4 mg daily  A/P BPH, symptoms improved on Flomax. -continue current meds

## 2017-03-17 NOTE — Progress Notes (Signed)
   CC: "I'm still trying to lose weight."  HPI:  [name] is a 66 y.o. man with history of HTN, DM, and HL who presents for management of diabetes.  Please see A&P for status of the patient's chronic medical conditions.  Wants to continue losing weight.  Walking 3-4x per week, 3-4 miles, 30 min to 2 hours.   Past Medical History:  Diagnosis Date  . HTN (hypertension), benign   . Hyperlipidemia   . Type 2 diabetes, uncontrolled, with neuropathy (HCC)     Review of Systems:   Review of Systems  Constitutional: Negative for chills and fever.  Respiratory: Negative for cough, shortness of breath and wheezing.   Cardiovascular: Negative for chest pain and palpitations.  Genitourinary: Negative for dysuria.       Nocturia, weak stream, urgency     Physical Exam:  Vitals:   03/21/17 1317  BP: 138/77  Pulse: 71  Temp: 97.9 F (36.6 C)  TempSrc: Oral  SpO2: 100%  Weight: 264 lb 11.2 oz (120.1 kg)  Height: 6' (1.829 m)   Body mass index is 35.9 kg/m.  Weight  03/2017 264 lbs 11/2016 274 lbs 03/2016 266 lbs 07/2015 248 lbs   Physical Exam  Constitutional: He is oriented to person, place, and time.  Obese man in no distress  Cardiovascular: Regular rhythm.   Pulmonary/Chest: Effort normal and breath sounds normal.  Neurological: He is alert and oriented to person, place, and time.  Psychiatric: He has a normal mood and affect. His behavior is normal.    Assessment & Plan:   See Encounters Tab for problem based charting.  Patient discussed with Dr. Criselda PeachesMullen

## 2017-03-17 NOTE — Assessment & Plan Note (Signed)
[facility]

## 2017-03-17 NOTE — Assessment & Plan Note (Addendum)
Lab Results  Component Value Date   HGBA1C 7.2 03/21/2017   HGBA1C 7.7 12/06/2016   HGBA1C 7.2 04/06/2016    Recent Labs  03/21/17 1334  GLUCAP 74    Current medications: metformin 1000 mg BID, glipizide 2.5 mg daily  Current insulin: none  Reports AM CBGs average 150s.  Take his morning medicine regularly, misses an afternoon dose of metformin about once per week.  Assessment HgbA1c goal: <7.0 Glycemic control: Improving Complications: polyneuropathy  Improving glycemic control with weight loss.  Plan Medications: continue metformin 1000 mg BID, increase glipizide to 5 mg daily Insulin: none Other: -encouraged to continue with exercise and weight loss

## 2017-03-17 NOTE — Assessment & Plan Note (Addendum)
BP Readings from Last 3 Encounters:  03/21/17 138/77  12/06/16 (!) 144/75  04/06/16 (!) 164/80   Lab Results  Component Value Date   CREATININE 0.96 04/06/2016   Lab Results  Component Value Date   K 4.2 04/06/2016    Current medications: HCTZ 12.5 mg daily, lisinopril 20 mg daily  Improved medication compliance.  Assessment BP goal: <130/80 BP control: Improved adherence, near goal  Plan Medications: continue current meds Other: -check BMP -discussed diet, exercise, weight loss

## 2017-03-21 ENCOUNTER — Ambulatory Visit (INDEPENDENT_AMBULATORY_CARE_PROVIDER_SITE_OTHER): Payer: Medicare Other | Admitting: Internal Medicine

## 2017-03-21 ENCOUNTER — Telehealth: Payer: Self-pay | Admitting: *Deleted

## 2017-03-21 VITALS — BP 138/77 | HR 71 | Temp 97.9°F | Ht 72.0 in | Wt 264.7 lb

## 2017-03-21 DIAGNOSIS — Z7984 Long term (current) use of oral hypoglycemic drugs: Secondary | ICD-10-CM | POA: Diagnosis not present

## 2017-03-21 DIAGNOSIS — E669 Obesity, unspecified: Secondary | ICD-10-CM | POA: Diagnosis not present

## 2017-03-21 DIAGNOSIS — R351 Nocturia: Secondary | ICD-10-CM

## 2017-03-21 DIAGNOSIS — R3911 Hesitancy of micturition: Secondary | ICD-10-CM | POA: Diagnosis not present

## 2017-03-21 DIAGNOSIS — E1140 Type 2 diabetes mellitus with diabetic neuropathy, unspecified: Secondary | ICD-10-CM

## 2017-03-21 DIAGNOSIS — I10 Essential (primary) hypertension: Secondary | ICD-10-CM

## 2017-03-21 DIAGNOSIS — N401 Enlarged prostate with lower urinary tract symptoms: Secondary | ICD-10-CM

## 2017-03-21 DIAGNOSIS — IMO0002 Reserved for concepts with insufficient information to code with codable children: Secondary | ICD-10-CM

## 2017-03-21 DIAGNOSIS — E1165 Type 2 diabetes mellitus with hyperglycemia: Principal | ICD-10-CM

## 2017-03-21 DIAGNOSIS — Z79899 Other long term (current) drug therapy: Secondary | ICD-10-CM

## 2017-03-21 DIAGNOSIS — E785 Hyperlipidemia, unspecified: Secondary | ICD-10-CM

## 2017-03-21 DIAGNOSIS — R3912 Poor urinary stream: Secondary | ICD-10-CM | POA: Diagnosis not present

## 2017-03-21 DIAGNOSIS — N521 Erectile dysfunction due to diseases classified elsewhere: Secondary | ICD-10-CM | POA: Diagnosis not present

## 2017-03-21 DIAGNOSIS — Z794 Long term (current) use of insulin: Secondary | ICD-10-CM

## 2017-03-21 DIAGNOSIS — E1142 Type 2 diabetes mellitus with diabetic polyneuropathy: Secondary | ICD-10-CM

## 2017-03-21 LAB — POCT GLYCOSYLATED HEMOGLOBIN (HGB A1C): HEMOGLOBIN A1C: 7.2

## 2017-03-21 LAB — GLUCOSE, CAPILLARY: Glucose-Capillary: 74 mg/dL (ref 65–99)

## 2017-03-21 MED ORDER — ONETOUCH ULTRASOFT LANCETS MISC: 12 refills | Status: AC

## 2017-03-21 MED ORDER — TAMSULOSIN HCL 0.4 MG PO CAPS: 0.4000 mg | ORAL_CAPSULE | Freq: Every day | ORAL | 3 refills | Status: DC

## 2017-03-21 MED ORDER — SILDENAFIL CITRATE 20 MG PO TABS: ORAL_TABLET | ORAL | 0 refills | Status: DC

## 2017-03-21 MED ORDER — METFORMIN HCL 1000 MG PO TABS: 1000.0000 mg | ORAL_TABLET | Freq: Two times a day (BID) | ORAL | 11 refills | Status: DC

## 2017-03-21 MED ORDER — GLUCOSE BLOOD VI STRP: ORAL_STRIP | 6 refills | Status: AC

## 2017-03-21 MED ORDER — LISINOPRIL 20 MG PO TABS: 20.0000 mg | ORAL_TABLET | Freq: Every day | ORAL | 5 refills | Status: DC

## 2017-03-21 MED ORDER — SILDENAFIL CITRATE 50 MG PO TABS: 50.0000 mg | ORAL_TABLET | Freq: Every day | ORAL | 0 refills | Status: DC | PRN

## 2017-03-21 MED ORDER — GLIPIZIDE 5 MG PO TABS: 5.0000 mg | ORAL_TABLET | Freq: Every day | ORAL | 3 refills | Status: DC

## 2017-03-21 MED ORDER — ROSUVASTATIN CALCIUM 20 MG PO TABS: 20.0000 mg | ORAL_TABLET | Freq: Every day | ORAL | 11 refills | Status: AC

## 2017-03-21 MED ORDER — HYDROCHLOROTHIAZIDE 12.5 MG PO CAPS: 12.5000 mg | ORAL_CAPSULE | Freq: Every day | ORAL | 5 refills | Status: DC

## 2017-03-21 NOTE — Patient Instructions (Signed)
Diabetes and blood pressure are doing great! Congratulations on losing some weight, but making a big difference.  Let's increase the dose of your glipizide to 1 whole pill (5 mg) once daily.  Please schedule an appointment come back in 3 months.

## 2017-03-21 NOTE — Assessment & Plan Note (Deleted)
1-2 40z beers 1x per month when he gets his check.

## 2017-03-21 NOTE — Assessment & Plan Note (Signed)
Reports progressive difficulty in achieving erections firm enough for sexual intercourse over the last several years. Denies lack of interest, and has a new girlfriend is concerned about inability to have intercourse. He reports no longer having spontaneous nocturnal erections, and is not sure when that started. Now with stimulation he is able to achieve partial erections.  A/P Erectile dysfunction likely secondary to diabetes and hypertension. -Sildenafil 40 mg PRN

## 2017-03-21 NOTE — Telephone Encounter (Signed)
Done

## 2017-03-21 NOTE — Telephone Encounter (Signed)
Call from pt's pharmacy-requesting sildenafil (viagra) 50 mg tabs be changed to sildenafil 20mg  tabs with corresponding directions.  Will forward request to pcp for review.  Please advise.Kingsley SpittleGoldston, Darlene Cassady6/4/20184:44 PM

## 2017-03-22 ENCOUNTER — Encounter: Payer: Self-pay | Admitting: Internal Medicine

## 2017-03-22 LAB — BMP8+ANION GAP
Anion Gap: 18 mmol/L (ref 10.0–18.0)
BUN/Creatinine Ratio: 11 (ref 10–24)
BUN: 11 mg/dL (ref 8–27)
CO2: 22 mmol/L (ref 18–29)
CREATININE: 0.96 mg/dL (ref 0.76–1.27)
Calcium: 9.7 mg/dL (ref 8.6–10.2)
Chloride: 99 mmol/L (ref 96–106)
GFR, EST AFRICAN AMERICAN: 94 mL/min/{1.73_m2} (ref >59)
GFR, EST NON AFRICAN AMERICAN: 81 mL/min/{1.73_m2} (ref >59)
Glucose: 79 mg/dL (ref 65–99)
POTASSIUM: 4 mmol/L (ref 3.5–5.2)
SODIUM: 139 mmol/L (ref 134–144)

## 2017-03-22 NOTE — Progress Notes (Signed)
Internal Medicine Clinic Attending  Case discussed with Dr. O'Sullivan at the time of the visit.  We reviewed the resident's history and exam and pertinent patient test results.  I agree with the assessment, diagnosis, and plan of care documented in the resident's note. 

## 2017-03-25 ENCOUNTER — Encounter: Payer: Self-pay | Admitting: *Deleted

## 2017-08-01 ENCOUNTER — Ambulatory Visit (INDEPENDENT_AMBULATORY_CARE_PROVIDER_SITE_OTHER): Payer: Medicare Other | Admitting: Dietician

## 2017-08-01 ENCOUNTER — Encounter: Payer: Self-pay | Admitting: Internal Medicine

## 2017-08-01 ENCOUNTER — Encounter: Payer: Self-pay | Admitting: Dietician

## 2017-08-01 ENCOUNTER — Ambulatory Visit (INDEPENDENT_AMBULATORY_CARE_PROVIDER_SITE_OTHER): Payer: Medicare Other | Admitting: Internal Medicine

## 2017-08-01 VITALS — BP 129/69 | HR 72 | Temp 98.2°F | Ht 72.0 in | Wt 268.7 lb

## 2017-08-01 DIAGNOSIS — Z0000 Encounter for general adult medical examination without abnormal findings: Secondary | ICD-10-CM

## 2017-08-01 DIAGNOSIS — Z23 Encounter for immunization: Secondary | ICD-10-CM | POA: Diagnosis not present

## 2017-08-01 DIAGNOSIS — I10 Essential (primary) hypertension: Secondary | ICD-10-CM

## 2017-08-01 DIAGNOSIS — IMO0002 Reserved for concepts with insufficient information to code with codable children: Secondary | ICD-10-CM

## 2017-08-01 DIAGNOSIS — R351 Nocturia: Secondary | ICD-10-CM | POA: Diagnosis not present

## 2017-08-01 DIAGNOSIS — N401 Enlarged prostate with lower urinary tract symptoms: Secondary | ICD-10-CM

## 2017-08-01 DIAGNOSIS — E785 Hyperlipidemia, unspecified: Secondary | ICD-10-CM

## 2017-08-01 DIAGNOSIS — E1165 Type 2 diabetes mellitus with hyperglycemia: Secondary | ICD-10-CM

## 2017-08-01 DIAGNOSIS — E1140 Type 2 diabetes mellitus with diabetic neuropathy, unspecified: Secondary | ICD-10-CM

## 2017-08-01 DIAGNOSIS — Z7984 Long term (current) use of oral hypoglycemic drugs: Secondary | ICD-10-CM

## 2017-08-01 LAB — POCT GLYCOSYLATED HEMOGLOBIN (HGB A1C): Hemoglobin A1C: 7.6

## 2017-08-01 LAB — GLUCOSE, CAPILLARY: GLUCOSE-CAPILLARY: 205 mg/dL — AB (ref 65–99)

## 2017-08-01 MED ORDER — LIRAGLUTIDE 18 MG/3ML ~~LOC~~ SOPN: 0.6000 mg | PEN_INJECTOR | Freq: Every day | SUBCUTANEOUS | 0 refills | Status: DC

## 2017-08-01 MED ORDER — LIRAGLUTIDE 18 MG/3ML ~~LOC~~ SOPN: 1.2000 mg | PEN_INJECTOR | Freq: Every day | SUBCUTANEOUS | 0 refills | Status: DC

## 2017-08-01 MED ORDER — TAMSULOSIN HCL 0.4 MG PO CAPS: 0.8000 mg | ORAL_CAPSULE | Freq: Every day | ORAL | 3 refills | Status: DC

## 2017-08-01 MED ORDER — TETANUS-DIPHTH-ACELL PERTUSSIS 5-2.5-18.5 LF-MCG/0.5 IM SUSP
0.5000 mL | Freq: Once | INTRAMUSCULAR | Status: AC
  Administered 2017-08-01: 0.5 mL via INTRAMUSCULAR

## 2017-08-01 NOTE — Patient Instructions (Signed)
Craig Young, you are doing very well.  I have started you on a new medication Victoza that will help keep your blood sugars under control.  Especially after you eat.  Keep up the good work with checking them.  If you begin having low blood sugars please stop taking the glipizide.  Since your report already taking it as needed I don't anticipate this being a problem.  Your blood pressure looked great today.  I have increased your tamsulosin to help with your nocturia.

## 2017-08-01 NOTE — Patient Instructions (Signed)
[name],   It was very nice meeting with you today!  Please call me if you have questions or concerns about your diabetes or the new medicine ( VICTOZA).   Please make a follow up in 2 months so we can discuss your diabetes self care in more detail.   Thank you!  Lupita Leash [phone]

## 2017-08-01 NOTE — Assessment & Plan Note (Signed)
Patient started on tamsulosin about 4 months ago with seemingly good result.  However patient today is complaining of increased nocturia he goes about 3 times per night.  He was previously on 0.4 mg of the tamsulosin daily.    -Increased tamsulosin to 0.8 mg daily

## 2017-08-01 NOTE — Assessment & Plan Note (Signed)
Lab Results  Component Value Date   HGBA1C 7.6 08/01/2017  hgb a1c back in June was 7.2.  Patient attributes this increase to the fact he is not walking as much and has had poor dietary control.  He also mentions he is not really taking his glipizide very often for fear of lows.  He brought a very detailed log of his blood sugars which he checks daily and there were no lows seen on this log.  He checks his sugar every day and says if it's around 200 he will take the glipizide.  I discussed with the patient the option of switching to a newer medication that will not drop his blood sugar low may provide some of his desired weight loss and will help to lower his A1c.  He was reluctant at first due to the fact it was a needle but after explaining it a bit more he was willing to give it a try.  The diabetes educator was able to come by and train the patient on how to use the injectable victoza.    -Continue metformin 1000 mg twice a day -Added victoza 0.6mg  daily for the first week followed by 1.2mg  weekly after that.   -patient agreed to not take the glipizide for now, it sounds like he was not taking it very frequently prior to today's visit.

## 2017-08-01 NOTE — Assessment & Plan Note (Addendum)
BP Readings from Last 3 Encounters:  08/01/17 129/69  03/21/17 138/77  12/06/16 (!) 144/75   Patient reports good compliance with his lisinopril 20 mg, HCTZ 12.5 mg.  Blood pressure looks good today at 129/69.  -Continue regimen listed above

## 2017-08-01 NOTE — Progress Notes (Signed)
   CC: Nocturia,   follow up T2DM, HTN  HPI:  Mr.Normand S Zukowski is a 66 y.o. reports not walking as much recently due to the poor weather.  He also mentions not doing as well with his diet eating a lot of breads which he knows he shouldn't.  He also reports that he has had some increased nocturia is going about 3 times per night and wonders if we can increase his dosage of tamsulosin.  Please see A&P for status of the patient's chronic medical conditions  Past Medical History:  Diagnosis Date  . HTN (hypertension), benign   . Hyperlipidemia   . Type 2 diabetes, uncontrolled, with neuropathy (HCC)    Review of Systems:  ROS: Pulmonary: pt denies increased work of breathing, shortness of breath,  Cardiac: pt denies palpitations, chest pain,  Abdominal: pt denies abdominal pain, nausea, vomiting, or diarrhea  Physical Exam:  Vitals:   08/01/17 1325  BP: 129/69  Pulse: 72  Temp: 98.2 F (36.8 C)  TempSrc: Oral  SpO2: 100%  Weight: 268 lb 11.2 oz (121.9 kg)  Height: 6' (1.829 m)   Physical Exam  Constitutional: He appears well-developed and well-nourished.  Eyes: Right eye exhibits no discharge. Left eye exhibits no discharge. No scleral icterus.  Cardiovascular: Normal rate, regular rhythm, normal heart sounds and intact distal pulses.  Exam reveals no gallop and no friction rub.   No murmur heard. Pulmonary/Chest: Effort normal and breath sounds normal. No respiratory distress. He has no wheezes. He has no rales.  Abdominal: Soft. Bowel sounds are normal. He exhibits no distension and no mass. There is no tenderness. There is no guarding.  Neurological: He is alert.    Social History   Social History  . Marital status: Married    Spouse name: N/A  . Number of children: N/A  . Years of education: 7   Occupational History  . mason Unemployed   Social History Main Topics  . Smoking status: Never Smoker  . Smokeless tobacco: Not on file  . Alcohol use 0.0  oz/week     Comment: Drinks 2-3 40s the day or two have he gets his monthly check.  Used to drink more.  . Drug use: No  . Sexual activity: Not on file   Other Topics Concern  . Not on file   Social History Narrative  . No narrative on file    No family history on file.  Assessment & Plan:   See Encounters Tab for problem based charting.  Patient seen with Dr. Rogelia Boga

## 2017-08-01 NOTE — Progress Notes (Signed)
Diabetes Self-Management Education  Visit Type: First/Initial  Appt. Start Time: 1530 Appt. End Time: 1545  08/01/2017  [name], identified by name and date of birth, is a 66 y.o. male with a diagnosis of Diabetes: Type 2.   ASSESSMENT  There were no vitals taken for this visit. There is no height or weight on file to calculate BMI.      Diabetes Self-Management Education - 08/01/17 1500      Visit Information   Visit Type First/Initial     Initial Visit   Diabetes Type Type 2   Are you currently following a meal plan? No   What type of meal plan do you follow? --  tries to eat balanced meals   Are you taking your medications as prescribed? Yes   Date Diagnosed --  2015     Health Coping   How would you rate your overall health? Fair     Psychosocial Assessment   Patient Belief/Attitude about Diabetes Motivated to manage diabetes   Self-care barriers None   Self-management support Doctor's office;CDE visits   Patient Concerns Medication   Special Needs None   Preferred Learning Style Hands on   Learning Readiness Ready   How often do you need to have someone help you when you read instructions, pamphlets, or other written materials from your doctor or pharmacy? 3 - Sometimes     Pre-Education Assessment   Patient understands using medications safely. Needs Instruction     Complications   Last HgB A1C per patient/outside source 7.6 %   How often do you check your blood sugar? 1-2 times/day   Number of hypoglycemic episodes per month --  0   Number of hyperglycemic episodes per week 2   Can you tell when your blood sugar is high? Yes   Have you had a dilated eye exam in the past 12 months? Yes   Are you checking your feet? --     Patient Education   Medications Reviewed patients medication for diabetes, action, purpose, timing of dose and side effects.  taught pt to use Victoza pen, gave self saline injection     Individualized Goals (developed  by patient)   Medications take my medication as prescribed     Outcomes   Expected Outcomes Demonstrated interest in learning. Expect positive outcomes   Future DMSE 2 wks   Program Status Completed      Individualized Plan for Diabetes Self-Management Training:   Learning Objective:  Patient will have a greater understanding of diabetes self-management. Patient education plan is to attend individual and/or group sessions per assessed needs and concerns.   Plan:   Patient Instructions  Craig Young,   It was very nice meeting with you today!  Please call me if you have questions or concerns about your diabetes or the new medicine ( VICTOZA).   Please make a follow up in 2 months so we can discuss your diabetes self care in more detail.   Thank you!  Lupita Leash [phone]   Expected Outcomes:  Demonstrated interest in learning. Expect positive outcomes  Education material provided: sharps container and sample of pen needles  If problems or questions, patient to contact team via:  Phone  Future DSME appointment: 2 wks 6 months Plyler, Lupita Leash, RD 08/01/2017 3:32 PM.

## 2017-08-01 NOTE — Assessment & Plan Note (Signed)
Patient was able to receive his T data, influenza vaccine, and ordered an eye exam for the patient.

## 2017-08-02 NOTE — Progress Notes (Signed)
Internal Medicine Clinic Attending  I saw and evaluated the patient.  I personally confirmed the key portions of the history and exam documented by Dr. Winfrey and I reviewed pertinent patient test results.  The assessment, diagnosis, and plan were formulated together and I agree with the documentation in the resident's note. 

## 2017-09-12 ENCOUNTER — Ambulatory Visit (INDEPENDENT_AMBULATORY_CARE_PROVIDER_SITE_OTHER): Payer: Medicare Other | Admitting: Internal Medicine

## 2017-09-12 ENCOUNTER — Other Ambulatory Visit: Payer: Self-pay

## 2017-09-12 ENCOUNTER — Encounter: Payer: Self-pay | Admitting: Internal Medicine

## 2017-09-12 VITALS — BP 122/80 | HR 70 | Temp 98.6°F | Ht 72.0 in | Wt 264.2 lb

## 2017-09-12 DIAGNOSIS — E1165 Type 2 diabetes mellitus with hyperglycemia: Secondary | ICD-10-CM

## 2017-09-12 DIAGNOSIS — E1140 Type 2 diabetes mellitus with diabetic neuropathy, unspecified: Secondary | ICD-10-CM

## 2017-09-12 DIAGNOSIS — E785 Hyperlipidemia, unspecified: Secondary | ICD-10-CM

## 2017-09-12 DIAGNOSIS — Z79899 Other long term (current) drug therapy: Secondary | ICD-10-CM | POA: Diagnosis not present

## 2017-09-12 DIAGNOSIS — I10 Essential (primary) hypertension: Secondary | ICD-10-CM | POA: Diagnosis not present

## 2017-09-12 DIAGNOSIS — Z7984 Long term (current) use of oral hypoglycemic drugs: Secondary | ICD-10-CM

## 2017-09-12 DIAGNOSIS — IMO0002 Reserved for concepts with insufficient information to code with codable children: Secondary | ICD-10-CM

## 2017-09-12 LAB — GLUCOSE, CAPILLARY: GLUCOSE-CAPILLARY: 95 mg/dL (ref 65–99)

## 2017-09-12 MED ORDER — LIRAGLUTIDE 18 MG/3ML ~~LOC~~ SOPN: 1.2000 mg | PEN_INJECTOR | Freq: Every day | SUBCUTANEOUS | 3 refills | Status: DC

## 2017-09-12 NOTE — Patient Instructions (Addendum)
Great to see you again, you're doing a great job.  I'm thrilled the Victoza is working so well for you your blood sugars look great. Today we will check your cholesterol to see how the rosuvastatin is working.  Please let us know if you need anything, otherwise we will see you in 3 months to check your A1C.

## 2017-09-12 NOTE — Assessment & Plan Note (Signed)
No A1C today since pt had one last month but he brought in his blood sugar log which looked great.  He has noticed weight loss of about 10lbs (gained some back over thanksgiving weekend) decreased appetite and is very happy with victoza 1.2mg  daily.  He is off glipizide and still taking metformin 2000mg  daily.    -Continue current regimen and recheck A1C in around 3 months.  Attached sugar log below fasting is first number before bed is second number

## 2017-09-12 NOTE — Assessment & Plan Note (Addendum)
BP Readings from Last 3 Encounters:  09/12/17 122/80  08/01/17 129/69  03/21/17 138/77   Blood pressure looks good today, pt on lisinopril 20mg  daily and HCTZ 12.5mg  daily.  -continue regimen above

## 2017-09-12 NOTE — Progress Notes (Signed)
   CC: follow up on HTN, HLD, T2DM  HPI:  [name] is a 66 y.o. male with PMH below, Here to follow up on HTN, HLD and T2DM.  He has overall been doing well.  Has been very encouraged with the results from starting the victoza during our last visit.  He has lost 10lbs.  He is a very motivated pt and always brings his blood sugar log with him.  He had an eye exam about 3 weeks ago that was negative for retinopathy.  Told pt to continue to strive for weight loss and we would discuss further strategies at our next visit.     Does not take BP at home but has been well controlled on last few visits  Recheck bp 122/80  Pt was fasting for lipid panel  Please see A&P for status of the patient's chronic medical conditions  Past Medical History:  Diagnosis Date  . HTN (hypertension), benign   . Hyperlipidemia   . Type 2 diabetes, uncontrolled, with neuropathy (HCC)    Review of Systems:   ROS: Pulmonary: pt denies increased work of breathing, shortness of breath,  Cardiac: pt denies palpitations, chest pain,  Abdominal: pt denies abdominal pain, nausea, vomiting, or diarrhea  Physical Exam:  Vitals:   09/12/17 1341 09/12/17 1437  BP: (!) 141/67 122/80  Pulse: 80 70  Temp: 98.6 F (37 C)   TempSrc: Oral   SpO2: 100%   Weight: 264 lb 3.2 oz (119.8 kg)   Height: 6' (1.829 m)    BP Readings from Last 3 Encounters:  09/12/17 122/80  08/01/17 129/69  03/21/17 138/77   Physical Exam  Constitutional: He appears well-developed and well-nourished.  Eyes: Right eye exhibits no discharge. Left eye exhibits no discharge. No scleral icterus.  Cardiovascular: Normal rate, regular rhythm, normal heart sounds and intact distal pulses. Exam reveals no gallop and no friction rub.  No murmur heard. Pulmonary/Chest: Effort normal and breath sounds normal. No respiratory distress. He has no wheezes. He has no rales.  Abdominal: Soft. Bowel sounds are normal. He exhibits no distension  and no mass. There is no tenderness. There is no guarding.  Neurological: He is alert.    Social History   Socioeconomic History  . Marital status: Married    Spouse name: Not on file  . Number of children: Not on file  . Years of education: 6114  . Highest education level: Not on file  Social Needs  . Financial resource strain: Not on file  . Food insecurity - worry: Not on file  . Food insecurity - inability: Not on file  . Transportation needs - medical: Not on file  . Transportation needs - non-medical: Not on file  Occupational History  . Occupation: Presenter, broadcastingmason    Employer: UNEMPLOYED  Tobacco Use  . Smoking status: Never Smoker  . Smokeless tobacco: Never Used  Substance and Sexual Activity  . Alcohol use: Yes    Alcohol/week: 0.0 oz  . Drug use: No  . Sexual activity: Not on file  Other Topics Concern  . Not on file  Social History Narrative  . Not on file    No family history on file.  Assessment & Plan:   See Encounters Tab for problem based charting.  Patient seen with Dr. Criselda PeachesMullen

## 2017-09-12 NOTE — Assessment & Plan Note (Signed)
Lab Results  Component Value Date   CHOL 187 04/06/2016   HDL 37 (L) 04/06/2016   LDLCALC 105 (H) 04/06/2016   TRIG 225 (H) 04/06/2016   CHOLHDL 5.1 (H) 04/06/2016   Pt was switched from lovastatin 20mg  to rosuvastatin 20mg  daily in February 2018.  Could not tolerate atorvastatin in the past.  -continue rosuvastatin 20mg  -will check lipid panel today to see if pt has improvement on high intensity statin.

## 2017-09-13 LAB — LIPID PANEL
CHOLESTEROL TOTAL: 114 mg/dL (ref 100–199)
Chol/HDL Ratio: 3.5 ratio (ref 0.0–5.0)
HDL: 33 mg/dL — AB (ref >39)
LDL CALC: 57 mg/dL (ref 0–99)
TRIGLYCERIDES: 118 mg/dL (ref 0–149)
VLDL Cholesterol Cal: 24 mg/dL (ref 5–40)

## 2017-09-13 NOTE — Progress Notes (Signed)
Internal Medicine Clinic Attending  I saw and evaluated the patient.  I personally confirmed the key portions of the history and exam documented by Dr. Winfrey and I reviewed pertinent patient test results.  The assessment, diagnosis, and plan were formulated together and I agree with the documentation in the resident's note. 

## 2017-10-07 ENCOUNTER — Other Ambulatory Visit: Payer: Self-pay | Admitting: *Deleted

## 2017-10-07 DIAGNOSIS — IMO0002 Reserved for concepts with insufficient information to code with codable children: Secondary | ICD-10-CM

## 2017-10-07 DIAGNOSIS — E1165 Type 2 diabetes mellitus with hyperglycemia: Principal | ICD-10-CM

## 2017-10-07 DIAGNOSIS — I10 Essential (primary) hypertension: Secondary | ICD-10-CM

## 2017-10-07 DIAGNOSIS — E1140 Type 2 diabetes mellitus with diabetic neuropathy, unspecified: Secondary | ICD-10-CM

## 2017-10-07 MED ORDER — HYDROCHLOROTHIAZIDE 12.5 MG PO CAPS: 12.5000 mg | ORAL_CAPSULE | Freq: Every day | ORAL | 5 refills | Status: DC

## 2017-10-07 MED ORDER — LISINOPRIL 20 MG PO TABS: 20.0000 mg | ORAL_TABLET | Freq: Every day | ORAL | 5 refills | Status: DC

## 2017-11-01 ENCOUNTER — Other Ambulatory Visit: Payer: Self-pay | Admitting: *Deleted

## 2017-11-01 DIAGNOSIS — E1140 Type 2 diabetes mellitus with diabetic neuropathy, unspecified: Secondary | ICD-10-CM

## 2017-11-01 DIAGNOSIS — IMO0002 Reserved for concepts with insufficient information to code with codable children: Secondary | ICD-10-CM

## 2017-11-01 DIAGNOSIS — E1165 Type 2 diabetes mellitus with hyperglycemia: Principal | ICD-10-CM

## 2017-11-01 MED ORDER — METFORMIN HCL 1000 MG PO TABS: 1000.0000 mg | ORAL_TABLET | Freq: Two times a day (BID) | ORAL | 3 refills | Status: DC

## 2017-11-01 NOTE — Telephone Encounter (Signed)
Refilled

## 2018-01-16 ENCOUNTER — Encounter (INDEPENDENT_AMBULATORY_CARE_PROVIDER_SITE_OTHER): Payer: Self-pay

## 2018-01-16 ENCOUNTER — Ambulatory Visit (INDEPENDENT_AMBULATORY_CARE_PROVIDER_SITE_OTHER): Payer: Medicare Other | Admitting: Internal Medicine

## 2018-01-16 ENCOUNTER — Other Ambulatory Visit: Payer: Self-pay

## 2018-01-16 ENCOUNTER — Encounter: Payer: Self-pay | Admitting: Internal Medicine

## 2018-01-16 VITALS — BP 122/72 | HR 75 | Temp 98.4°F | Ht 72.0 in | Wt 263.0 lb

## 2018-01-16 DIAGNOSIS — Z794 Long term (current) use of insulin: Secondary | ICD-10-CM

## 2018-01-16 DIAGNOSIS — E785 Hyperlipidemia, unspecified: Secondary | ICD-10-CM

## 2018-01-16 DIAGNOSIS — E1140 Type 2 diabetes mellitus with diabetic neuropathy, unspecified: Secondary | ICD-10-CM

## 2018-01-16 DIAGNOSIS — Z79899 Other long term (current) drug therapy: Secondary | ICD-10-CM

## 2018-01-16 DIAGNOSIS — E1165 Type 2 diabetes mellitus with hyperglycemia: Principal | ICD-10-CM

## 2018-01-16 DIAGNOSIS — I10 Essential (primary) hypertension: Secondary | ICD-10-CM

## 2018-01-16 DIAGNOSIS — N401 Enlarged prostate with lower urinary tract symptoms: Secondary | ICD-10-CM | POA: Diagnosis not present

## 2018-01-16 DIAGNOSIS — R351 Nocturia: Secondary | ICD-10-CM

## 2018-01-16 DIAGNOSIS — IMO0002 Reserved for concepts with insufficient information to code with codable children: Secondary | ICD-10-CM

## 2018-01-16 LAB — POCT GLYCOSYLATED HEMOGLOBIN (HGB A1C): Hemoglobin A1C: 7

## 2018-01-16 LAB — GLUCOSE, CAPILLARY: Glucose-Capillary: 138 mg/dL — ABNORMAL HIGH (ref 65–99)

## 2018-01-16 MED ORDER — LIRAGLUTIDE 18 MG/3ML ~~LOC~~ SOPN: 1.2000 mg | PEN_INJECTOR | Freq: Every day | SUBCUTANEOUS | 3 refills | Status: DC

## 2018-01-16 MED ORDER — TAMSULOSIN HCL 0.4 MG PO CAPS: 0.8000 mg | ORAL_CAPSULE | Freq: Every day | ORAL | 3 refills | Status: DC

## 2018-01-16 NOTE — Assessment & Plan Note (Signed)
Pt reports less frequent urinary symptoms on the 0.8mg  daily dose.  He says at this point it is very manageable.  -continue flomax 0.8mg  daily.

## 2018-01-16 NOTE — Assessment & Plan Note (Signed)
BP Readings from Last 3 Encounters:  01/16/18 122/72  09/12/17 122/80  08/01/17 129/69   Pts blood pressure well within goal today.    -will check labs, continue HCTZ 12.5mg  and lisinopril 20mg  daily

## 2018-01-16 NOTE — Progress Notes (Signed)
CC: follow up on T2DM, HTN, HLD  HPI:  [name] is a 67 y.o. Craig Young brought in a log of his blood sugars which looked great.  Craig Young reports that his weight loss on Victoza has plateaued but Craig Young is very happy with his blood sugars.  We discussed going up on the dose some but Craig Young is not interested at this time.    Please see A&P for status of the patient's chronic medical conditions  Past Medical History:  Diagnosis Date  . HTN (hypertension), benign   . Hyperlipidemia   . Type 2 diabetes, uncontrolled, with neuropathy (HCC)    Review of Systems:  ROS: Pulmonary: pt denies increased work of breathing, shortness of breath,  Cardiac: pt denies palpitations, chest pain,  Abdominal: pt denies abdominal pain, nausea, vomiting, or diarrhea  Physical Exam:  Vitals:   01/16/18 1341  BP: 122/72  Pulse: 75  Temp: 98.4 F (36.9 C)  TempSrc: Oral  SpO2: 98%  Weight: 263 lb (119.3 kg)  Height: 6' (1.829 m)   Physical Exam  Constitutional: Craig Young appears well-developed and well-nourished.  Eyes: Right eye exhibits no discharge. Left eye exhibits no discharge. No scleral icterus.  Cardiovascular: Normal rate, regular rhythm, normal heart sounds and intact distal pulses. Exam reveals no gallop and no friction rub.  No murmur heard. Pulmonary/Chest: Effort normal and breath sounds normal. No respiratory distress. Craig Young has no wheezes. Craig Young has no rales.  Abdominal: Soft. Bowel sounds are normal. Craig Young exhibits no distension and no mass. There is no tenderness. There is no guarding.  Neurological: Craig Young is alert.    Social History   Socioeconomic History  . Marital status: Married    Spouse name: Not on file  . Number of children: Not on file  . Years of education: 5314  . Highest education level: Not on file  Occupational History  . Occupation: Presenter, broadcastingmason    Employer: UNEMPLOYED  Social Needs  . Financial resource strain: Not on file  . Food insecurity:    Worry: Not on file   Inability: Not on file  . Transportation needs:    Medical: Not on file    Non-medical: Not on file  Tobacco Use  . Smoking status: Never Smoker  . Smokeless tobacco: Never Used  Substance and Sexual Activity  . Alcohol use: Yes    Alcohol/week: 0.0 oz  . Drug use: No  . Sexual activity: Not on file  Lifestyle  . Physical activity:    Days per week: Not on file    Minutes per session: Not on file  . Stress: Not on file  Relationships  . Social connections:    Talks on phone: Not on file    Gets together: Not on file    Attends religious service: Not on file    Active member of club or organization: Not on file    Attends meetings of clubs or organizations: Not on file    Relationship status: Not on file  . Intimate partner violence:    Fear of current or ex partner: Not on file    Emotionally abused: Not on file    Physically abused: Not on file    Forced sexual activity: Not on file  Other Topics Concern  . Not on file  Social History Narrative  . Not on file    No family history on file.  Assessment & Plan:   See Encounters Tab for problem based charting.  Patient discussed  with Dr. Angelia Mould

## 2018-01-16 NOTE — Patient Instructions (Addendum)
Great to see you again.  Keep up the good work on your diabetes.  Thanks for bringing a log with you of your sugars.  Your blood pressure looks great.  You're up to date on your screening exams.

## 2018-01-16 NOTE — Assessment & Plan Note (Addendum)
Lab Results  Component Value Date   HGBA1C 7.0 01/16/2018  pts a1c is down from 7.6, it has continued to go down and he is now at his goal.  He brought a log again today which looked great. He is very compliant and has done an excellent job.    -continue metformin 2000mg  daily -continue victoza 1.2mg  daily, pt not interested in increasing dosage which is perfectly fine -will check renal function panel and urine microalbumin -Pt asked for a podiatry referral to help manage his toenails

## 2018-01-17 LAB — RENAL FUNCTION PANEL
Albumin: 4.5 g/dL (ref 3.6–4.8)
BUN / CREAT RATIO: 10 (ref 10–24)
BUN: 10 mg/dL (ref 8–27)
CHLORIDE: 97 mmol/L (ref 96–106)
CO2: 21 mmol/L (ref 20–29)
Calcium: 9.5 mg/dL (ref 8.6–10.2)
Creatinine, Ser: 1.04 mg/dL (ref 0.76–1.27)
GFR calc non Af Amer: 73 mL/min/{1.73_m2} (ref >59)
GFR, EST AFRICAN AMERICAN: 85 mL/min/{1.73_m2} (ref >59)
Glucose: 119 mg/dL — ABNORMAL HIGH (ref 65–99)
Phosphorus: 4.2 mg/dL (ref 2.5–4.5)
Potassium: 3.8 mmol/L (ref 3.5–5.2)
SODIUM: 138 mmol/L (ref 134–144)

## 2018-01-17 LAB — MICROALBUMIN / CREATININE URINE RATIO
Creatinine, Urine: 150.3 mg/dL
Microalb/Creat Ratio: 27.2 mg/g creat (ref 0.0–30.0)
Microalbumin, Urine: 40.9 ug/mL

## 2018-01-19 NOTE — Progress Notes (Signed)
Internal Medicine Clinic Attending  Case discussed with Dr. Winfrey  at the time of the visit.  We reviewed the resident's history and exam and pertinent patient test results.  I agree with the assessment, diagnosis, and plan of care documented in the resident's note.  

## 2018-02-17 ENCOUNTER — Ambulatory Visit (INDEPENDENT_AMBULATORY_CARE_PROVIDER_SITE_OTHER): Payer: Medicare Other | Admitting: Podiatry

## 2018-02-17 ENCOUNTER — Encounter: Payer: Self-pay | Admitting: Podiatry

## 2018-02-17 VITALS — BP 168/91 | HR 67

## 2018-02-17 DIAGNOSIS — M2010 Hallux valgus (acquired), unspecified foot: Secondary | ICD-10-CM | POA: Diagnosis not present

## 2018-02-17 DIAGNOSIS — B351 Tinea unguium: Secondary | ICD-10-CM

## 2018-02-17 DIAGNOSIS — E1142 Type 2 diabetes mellitus with diabetic polyneuropathy: Secondary | ICD-10-CM

## 2018-02-17 DIAGNOSIS — M79675 Pain in left toe(s): Secondary | ICD-10-CM

## 2018-02-17 DIAGNOSIS — M79674 Pain in right toe(s): Secondary | ICD-10-CM | POA: Diagnosis not present

## 2018-02-17 NOTE — Progress Notes (Signed)
This patient presents to the office with chief complaint of long thick nails and diabetic feet.  This patient  says he is having no pain and discomfort in his feet.  This patient says he has long thick painful nails.  These nails are painful walking and wearing shoes.  He has no history of infection or drainage from both feet.  This patient presents the office today for treatment of the  long nails and a foot evaluation due to history of  diabetes.  General Appearance  Alert, conversant and in no acute stress.  Vascular  Dorsalis pedis and posterior tibial  pulses are palpable  bilaterally.  Capillary return is within normal limits  bilaterally. Temperature is within normal limits  bilaterally.  Neurologic  Senn-Weinstein monofilament wire test diminished   bilaterally. Muscle power within normal limits bilaterally.  Nails Thick disfigured discolored nails with subungual debris  from hallux to fifth toes bilaterally. No evidence of bacterial infection or drainage bilaterally.  Orthopedic  No limitations of motion of motion feet .  No crepitus or effusions noted. HAV  B/L.  Skin  normotropic skin with no porokeratosis noted bilaterally.  No signs of infections or ulcers noted.     Onychomycosis  Diabetes with no foot complications  IE  Debride nails x 10.  A diabetic foot exam was performed and there is no evidence of any vascular or neurologic pathology.   RTC 3 months.   Helane Gunther DPM

## 2018-04-14 ENCOUNTER — Other Ambulatory Visit: Payer: Self-pay | Admitting: Pharmacist

## 2018-04-14 NOTE — Patient Outreach (Signed)
[facility] Care Management  04/14/2018  Craig Young Nov 08, 1950 [id number]   Incoming call from Craig Young in response to the [facility] Medication Adherence Campaign. Speak with patient. HIPAA identifiers verified and verbal consent received.  Craig Young reports that he takes his metformin 1000 mg twice daily as directed and his Victoza injection 1.2 mg daily. Denies missed doses of his metformin, but reports that he needs to get his Victoza refilled on Monday. Counsel patient about the importance of medication adherence. Patient reports that it would help him to receive a 90 day supply of the Victoza to be more adherent. [facility], but has switched to [facility].   Craig Young denies any further medication questions/concerns at this time. Provide patient with my phone number.  PLAN  1) Will send an InBasket message to patient's PCP to request that he send a 90 day supply prescription of the Victoza to patient's [facility].  2) Will close pharmacy episode.  Duanne MoronElisabeth Geni Skorupski, PharmD, [facility]BCACP Clinical Pharmacist [facility] Care Management [phone]

## 2018-04-17 ENCOUNTER — Other Ambulatory Visit: Payer: Self-pay | Admitting: *Deleted

## 2018-04-17 DIAGNOSIS — E1165 Type 2 diabetes mellitus with hyperglycemia: Principal | ICD-10-CM

## 2018-04-17 DIAGNOSIS — E1140 Type 2 diabetes mellitus with diabetic neuropathy, unspecified: Secondary | ICD-10-CM

## 2018-04-17 DIAGNOSIS — IMO0002 Reserved for concepts with insufficient information to code with codable children: Secondary | ICD-10-CM

## 2018-04-17 MED ORDER — LIRAGLUTIDE 18 MG/3ML ~~LOC~~ SOPN: 1.2000 mg | PEN_INJECTOR | Freq: Every day | SUBCUTANEOUS | 3 refills | Status: DC

## 2018-04-17 NOTE — Telephone Encounter (Signed)
Refill is sent

## 2018-04-17 NOTE — Telephone Encounter (Signed)
Elizabeth with [facility] called in asking for 90 day supply of victo[facility] (18 mL) [facility] Kinnie FeilL. Mazen Marcin, RN, BSN

## 2018-05-10 ENCOUNTER — Other Ambulatory Visit: Payer: Self-pay | Admitting: Internal Medicine

## 2018-05-10 DIAGNOSIS — N401 Enlarged prostate with lower urinary tract symptoms: Secondary | ICD-10-CM

## 2018-05-10 DIAGNOSIS — R351 Nocturia: Principal | ICD-10-CM

## 2018-05-10 NOTE — Telephone Encounter (Signed)
refilled 

## 2018-05-18 ENCOUNTER — Other Ambulatory Visit: Payer: Self-pay | Admitting: *Deleted

## 2018-05-18 DIAGNOSIS — I10 Essential (primary) hypertension: Secondary | ICD-10-CM

## 2018-05-18 DIAGNOSIS — E1140 Type 2 diabetes mellitus with diabetic neuropathy, unspecified: Secondary | ICD-10-CM

## 2018-05-18 DIAGNOSIS — IMO0002 Reserved for concepts with insufficient information to code with codable children: Secondary | ICD-10-CM

## 2018-05-18 DIAGNOSIS — E1165 Type 2 diabetes mellitus with hyperglycemia: Principal | ICD-10-CM

## 2018-05-18 MED ORDER — LISINOPRIL 20 MG PO TABS: 20.0000 mg | ORAL_TABLET | Freq: Every day | ORAL | 3 refills | Status: DC

## 2018-05-18 NOTE — Telephone Encounter (Signed)
refilled 

## 2018-05-18 NOTE — Telephone Encounter (Signed)
Next appt scheduled 10/07 with PCP.

## 2018-05-26 ENCOUNTER — Ambulatory Visit: Payer: Medicare Other | Admitting: Podiatry

## 2018-07-24 ENCOUNTER — Encounter: Payer: Medicare Other | Admitting: Internal Medicine

## 2018-07-24 ENCOUNTER — Encounter: Payer: Self-pay | Admitting: Internal Medicine

## 2018-07-24 NOTE — Progress Notes (Deleted)
   CC: obesity, T2DM  HPI:  Mr.Quirino S Axel is a 67 y.o. male with PMH below.  Today we will address obesity, T2DM  T2DM:  Lab Results  Component Value Date   HGBA1C 7.0 01/16/2018   Patient within goal on last A1C his A1C today is ***.  He is currently on victoza 1.2 daily and metformin 1000 BID.  His weight is There were no vitals filed for this visit. He has had some success on victoza so far.    -increase victoza to 1.8 daily and continue full dose metformin  Obesity:  Wt Readings from Last 3 Encounters:  01/16/18 263 lb (119.3 kg)  09/12/17 264 lb 3.2 oz (119.8 kg)  08/01/17 268 lb 11.2 oz (121.9 kg)   Patient has had trouble with his weight for some time.  He said the victoza was helpful initially but the effect has waned some.  His main problems seem to be *** diet, lack of exercise etc.      Please see A&P for status of the patient's chronic medical conditions  Past Medical History:  Diagnosis Date  . HTN (hypertension), benign   . Hyperlipidemia   . Type 2 diabetes, uncontrolled, with neuropathy (HCC)    Review of Systems:  ***  Physical Exam:  There were no vitals filed for this visit. ***  Social History   Socioeconomic History  . Marital status: Married    Spouse name: Not on file  . Number of children: Not on file  . Years of education: 90  . Highest education level: Not on file  Occupational History  . Occupation: Presenter, broadcasting: UNEMPLOYED  Social Needs  . Financial resource strain: Not on file  . Food insecurity:    Worry: Not on file    Inability: Not on file  . Transportation needs:    Medical: Not on file    Non-medical: Not on file  Tobacco Use  . Smoking status: Never Smoker  . Smokeless tobacco: Never Used  Substance and Sexual Activity  . Alcohol use: Yes    Alcohol/week: 0.0 standard drinks  . Drug use: No  . Sexual activity: Not on file  Lifestyle  . Physical activity:    Days per week: Not on file    Minutes  per session: Not on file  . Stress: Not on file  Relationships  . Social connections:    Talks on phone: Not on file    Gets together: Not on file    Attends religious service: Not on file    Active member of club or organization: Not on file    Attends meetings of clubs or organizations: Not on file    Relationship status: Not on file  . Intimate partner violence:    Fear of current or ex partner: Not on file    Emotionally abused: Not on file    Physically abused: Not on file    Forced sexual activity: Not on file  Other Topics Concern  . Not on file  Social History Narrative  . Not on file   *** No family history on file.  Assessment & Plan:   See Encounters Tab for problem based charting.  Patient {GC/GE:3044014::"discussed with","seen with"} Dr. {NAMES:[id number]::"Butcher","Granfortuna","E. Hoffman","Klima","Mullen","Narendra","Raines","Vincent"}

## 2019-08-14 ENCOUNTER — Telehealth: Payer: Self-pay | Admitting: Dietician

## 2019-08-14 ENCOUNTER — Encounter: Payer: Self-pay | Admitting: Dietician

## 2019-08-14 NOTE — Telephone Encounter (Signed)
Tried calling this patient. Their voicemail box is not set up. I was unable to leave a message Will mail letter.

## 2019-12-16 ENCOUNTER — Ambulatory Visit: Payer: Medicare Other | Attending: Internal Medicine

## 2019-12-16 DIAGNOSIS — Z23 Encounter for immunization: Secondary | ICD-10-CM | POA: Insufficient documentation

## 2019-12-16 NOTE — Progress Notes (Signed)
   Covid-19 Vaccination Clinic  Name:  YOSKAR MURRILLO    MRN: [id number] DOB: [birth year]  12/16/2019  Mr. Simson was observed post Covid-19 immunization for 15 minutes without incidence. He was provided with Vaccine Information Sheet and instruction to access the V-Safe system.   Mr. Balingit was instructed to call 911 with any severe reactions post vaccine: Marland Kitchen Difficulty breathing  . Swelling of your face and throat  . A fast heartbeat  . A bad rash all over your body  . Dizziness and weakness    Immunizations Administered    Name Date Dose VIS Date Route   Pfizer COVID-19 Vaccine 12/16/2019  4:15 PM 0.3 mL 09/28/2019 Intramuscular   Manufacturer: [employer], [employer]   Lot: [id number]   NDC: [address]-3

## 2020-01-15 ENCOUNTER — Ambulatory Visit: Payer: Medicare Other | Attending: Internal Medicine

## 2020-01-15 DIAGNOSIS — Z23 Encounter for immunization: Secondary | ICD-10-CM

## 2020-01-15 NOTE — Progress Notes (Signed)
   Covid-19 Vaccination Clinic  Name:  Craig Young    MRN: [id number] DOB: 11-11-1950  01/15/2020  [name] was observed post Covid-19 immunization for 15 minutes without incident. He was provided with Vaccine Information Sheet and instruction to access the V-Safe system.   [name] was instructed to call 911 with any severe reactions post vaccine: Marland Kitchen Difficulty breathing  . Swelling of face and throat  . A fast heartbeat  . A bad rash all over body  . Dizziness and weakness   Immunizations Administered    Name Date Dose VIS Date Route   Pfizer COVID-19 Vaccine 01/15/2020  8:36 AM 0.3 mL 09/28/2019 Intramuscular   Manufacturer: [employer], [employer]   Lot: [id number]   NDC: [address]-6

## 2020-12-30 NOTE — Progress Notes (Unsigned)
Cardiology Office Note:    Date:  01/01/2021   ID:  Craig Young, DOB 06-26-1951, MRN [id number]  PCP:  Andree Moro, [place]  Cardiologist:  No primary care provider on file.  Advanced Practice Provider:  No care team member to display Electrophysiologist:  None   Referring MD: Andree Moro, DO    History of Present Illness:    Craig Young is a 70 y.o. male with a hx of HTN, HLD, and DMII who was referred by Dr. Posey Pronto for further management of hypertension.  The patient states that he feels overall well. He is very active and walks at least 84mn-1hour per day without issue. Sometimes feels tired in the morning with house work but this improves and he has no issues walking later in the day. Denies chest pain, SOB, nausea, vomiting. Blood pressure is well controlled at home. No lightheadedness, dizziness, or syncope. Takes all medications as prescribed. Very motivated to stay healthy.  TC 134, HDL 49, LDL 66.  Past Medical History:  Diagnosis Date  . HTN (hypertension), benign   . Hyperlipidemia   . Type 2 diabetes, uncontrolled, with neuropathy ([facility]     History reviewed. No pertinent surgical history.  Current Medications: Current Meds  Medication Sig  . aspirin EC 81 MG tablet Take 81 mg by mouth daily.  . Blood Glucose Monitoring Suppl (ONETOUCH VERIO IQ SYSTEM) W/DEVICE KIT Check blood sugar once daily as instructed  . Coenzyme Q10 (COQ-10) 100 MG capsule Take 100 mg by mouth daily.  .Marland Kitchenglucose blood (ONETOUCH VERIO) test strip Use as instructed to check blood sugar 3 times per day.  Dx code E11.40. Non insulin dependent  . hydrochlorothiazide (MICROZIDE) 12.5 MG capsule Take 12.5 mg by mouth daily.  . Lancets (ONETOUCH ULTRASOFT) lancets Use as instructed  . liraglutide (VICTOZA) 18 MG/3ML SOPN Inject into the skin.  .Marland Kitchenlisinopril (ZESTRIL) 20 MG tablet Take 20 mg by mouth daily.  . metFORMIN (GLUCOPHAGE) 1000 MG tablet Take  1,000 mg by mouth 2 (two) times daily with a meal.  . OZEMPIC, 1 MG/DOSE, 4 MG/3ML SOPN   . rosuvastatin (CRESTOR) 20 MG tablet Take 1 tablet (20 mg total) by mouth daily.  . tamsulosin (FLOMAX) 0.4 MG CAPS capsule TAKE 2 CAPSULES (0.8 MG TOTAL) BY MOUTH DAILY AFTER BREAKFAST.  . [DISCONTINUED] liraglutide (VICTOZA) 18 MG/3ML SOPN Inject 0.2 mLs (1.2 mg total) into the skin daily.  . [DISCONTINUED] lisinopril (PRINIVIL,ZESTRIL) 20 MG tablet Take 1 tablet (20 mg total) by mouth daily.  . [DISCONTINUED] metFORMIN (GLUCOPHAGE) 1000 MG tablet Take 1 tablet (1,000 mg total) by mouth 2 (two) times daily with a meal.     Allergies:   Atorvastatin   Social History   Socioeconomic History  . Marital status: Married    Spouse name: Not on file  . Number of children: Not on file  . Years of education: 160 . Highest education level: Not on file  Occupational History  . 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 Smoking status: Never Smoker  . Smokeless tobacco: Never Used  Substance and Sexual Activity  . Alcohol use: Yes    Alcohol/week: 0.0 standard drinks  . Drug use: No  . Sexual activity: Not on file  Other Topics Concern  . Not on file  Social History Narrative  . Not on file   Social Determinants of Health   Financial Resource Strain: Not  on file  Food Insecurity: Not on file  Transportation Needs: Not on file  Physical Activity: Not on file  Stress: Not on file  Social Connections: Not on file     Family History: The patient's family history is not on file.  ROS:   Please see the history of present illness.    Review of Systems  Constitutional: Negative for chills and fever.  HENT: Negative for hearing loss and sore throat.   Eyes: Negative for blurred vision and redness.  Respiratory: Negative for sputum production and shortness of breath.   Cardiovascular: Negative for chest pain, palpitations, orthopnea, claudication, leg swelling and PND.   Gastrointestinal: Negative for melena, nausea and vomiting.  Genitourinary: Negative for dysuria and flank pain.  Musculoskeletal: Negative for falls and myalgias.  Neurological: Negative for dizziness and loss of consciousness.  Endo/Heme/Allergies: Negative for polydipsia.  Psychiatric/Behavioral: Negative for substance abuse.    EKGs/Labs/Other Studies Reviewed:    The following studies were reviewed today: No cardiac studies  EKG:  EKG is  ordered today.  The ekg ordered today demonstrates NSR with HR 85  Recent Labs: No results found for requested labs within last 8760 hours.  Recent Lipid Panel    Component Value Date/Time   CHOL 114 09/12/2017 1451   TRIG 118 09/12/2017 1451   HDL 33 (L) 09/12/2017 1451   CHOLHDL 3.5 09/12/2017 1451   CHOLHDL 6.8 02/05/2014 1138   VLDL 71 (H) 02/05/2014 1138   LDLCALC 57 09/12/2017 1451     Physical Exam:    VS:  BP 110/72   Pulse 85   Ht 6' (1.829 m)   Wt 232 lb (105.2 kg)   SpO2 99%   BMI 31.46 kg/m     Wt Readings from Last 3 Encounters:  01/01/21 232 lb (105.2 kg)  01/16/18 263 lb (119.3 kg)  09/12/17 264 lb 3.2 oz (119.8 kg)     GEN:  Well nourished, well developed in no acute distress HEENT: Normal NECK: No JVD; No carotid bruits CARDIAC: RRR, no murmurs, rubs, gallops RESPIRATORY:  Clear to auscultation without rales, wheezing or rhonchi  ABDOMEN: Soft, non-tender, non-distended MUSCULOSKELETAL:  No edema; No deformity  SKIN: Warm and dry NEUROLOGIC:  Alert and oriented x 3 PSYCHIATRIC:  Normal affect   ASSESSMENT:    1. HTN (hypertension), benign   2. Pure hypercholesterolemia   3. Type 2 diabetes mellitus without complication, without long-term current use of insulin (HCC)    PLAN:    In order of problems listed above:  #HTN: Very well controlled. -Continue HCTZ 12.23m daily -Continue lisinopril 266mdaily  #HLD: LDL 57. -Continue crestor 2071maily -Discussed coronary calcium score, but  patient declined at this time  #DMII: A1C 7.0. Managed well by PCP. -Continue metformin -Continue victoza     Medication Adjustments/Labs and Tests Ordered: Current medicines are reviewed at length with the patient today.  Concerns regarding medicines are outlined above.  Orders Placed This Encounter  Procedures  . EKG 12-Lead   No orders of the defined types were placed in this encounter.   Patient Instructions  Medication Instructions:  Your physician recommends that you continue on your current medications as directed. Please refer to the Current Medication list given to you today. *If you need a refill on your cardiac medications before your next appointment, please call your pharmacy*     Follow-Up: [facility] and [facility] health needs are our priority.  As part of our continuing  mission to provide you with exceptional heart care, we have created designated Provider Care Teams.  These Care Teams include your primary Cardiologist (physician) and Advanced Practice Providers (APPs -  Physician Assistants and Nurse Practitioners) who all work together to provide you with the care you need, when you need it.  We recommend signing up for the patient portal called "MyChart".  Sign up information is provided on this After Visit Summary.  MyChart is used to connect with patients for Virtual Visits (Telemedicine).  Patients are able to view lab/test results, encounter notes, upcoming appointments, etc.  Non-urgent messages can be sent to your provider as well.   To learn more about what you can do with MyChart, go to [email or web address].    Your next appointment:   12 month(s)  The format for your next appointment:   In Person  Provider:    Dr. Johney Frame        Signed, Freada Bergeron, MD  01/01/2021 4:15 PM    [place]

## 2021-01-01 ENCOUNTER — Other Ambulatory Visit: Payer: Self-pay

## 2021-01-01 ENCOUNTER — Ambulatory Visit (INDEPENDENT_AMBULATORY_CARE_PROVIDER_SITE_OTHER): Payer: Medicare Other | Admitting: Cardiology

## 2021-01-01 ENCOUNTER — Encounter: Payer: Self-pay | Admitting: Cardiology

## 2021-01-01 VITALS — BP 110/72 | HR 85 | Ht 72.0 in | Wt 232.0 lb

## 2021-01-01 DIAGNOSIS — E119 Type 2 diabetes mellitus without complications: Secondary | ICD-10-CM | POA: Diagnosis not present

## 2021-01-01 DIAGNOSIS — E7800 Pure hypercholesterolemia, unspecified: Secondary | ICD-10-CM

## 2021-01-01 DIAGNOSIS — I10 Essential (primary) hypertension: Secondary | ICD-10-CM | POA: Diagnosis not present

## 2021-01-01 NOTE — Patient Instructions (Addendum)
Medication Instructions:  Your physician recommends that you continue on your current medications as directed. Please refer to the Current Medication list given to you today. *If you need a refill on your cardiac medications before your next appointment, please call your pharmacy*     Follow-Up: At [facility], you and your health needs are our priority.  As part of our continuing mission to provide you with exceptional heart care, we have created designated Provider Care Teams.  These Care Teams include your primary Cardiologist (physician) and Advanced Practice Providers (APPs -  Physician Assistants and Nurse Practitioners) who all work together to provide you with the care you need, when you need it.  We recommend signing up for the patient portal called "MyChart".  Sign up information is provided on this After Visit Summary.  MyChart is used to connect with patients for Virtual Visits (Telemedicine).  Patients are able to view lab/test results, encounter notes, upcoming appointments, etc.  Non-urgent messages can be sent to your provider as well.   To learn more about what you can do with MyChart, go to [email or web address].    Your next appointment:   12 month(s)  The format for your next appointment:   In Person  Provider:    Dr. Shari Prows

## 2022-03-09 ENCOUNTER — Emergency Department (HOSPITAL_COMMUNITY): Payer: Medicare Other

## 2022-03-09 ENCOUNTER — Emergency Department (HOSPITAL_COMMUNITY)
Admission: EM | Admit: 2022-03-09 | Discharge: 2022-03-09 | Disposition: A | Discharge status: Home / Self Care | Payer: Medicare Other | Attending: Emergency Medicine | Admitting: Emergency Medicine

## 2022-03-09 DIAGNOSIS — I10 Essential (primary) hypertension: Secondary | ICD-10-CM | POA: Insufficient documentation

## 2022-03-09 DIAGNOSIS — Z794 Long term (current) use of insulin: Secondary | ICD-10-CM | POA: Diagnosis not present

## 2022-03-09 DIAGNOSIS — K209 Esophagitis, unspecified without bleeding: Secondary | ICD-10-CM | POA: Insufficient documentation

## 2022-03-09 DIAGNOSIS — R1114 Bilious vomiting: Secondary | ICD-10-CM

## 2022-03-09 DIAGNOSIS — Z79899 Other long term (current) drug therapy: Secondary | ICD-10-CM | POA: Insufficient documentation

## 2022-03-09 DIAGNOSIS — Z7982 Long term (current) use of aspirin: Secondary | ICD-10-CM | POA: Diagnosis not present

## 2022-03-09 DIAGNOSIS — R112 Nausea with vomiting, unspecified: Secondary | ICD-10-CM | POA: Insufficient documentation

## 2022-03-09 DIAGNOSIS — E1165 Type 2 diabetes mellitus with hyperglycemia: Secondary | ICD-10-CM | POA: Insufficient documentation

## 2022-03-09 DIAGNOSIS — R739 Hyperglycemia, unspecified: Secondary | ICD-10-CM

## 2022-03-09 LAB — CBG MONITORING, ED
Glucose-Capillary: 435 mg/dL — ABNORMAL HIGH (ref 70–99)
Glucose-Capillary: 396 mg/dL — ABNORMAL HIGH (ref 70–99)

## 2022-03-09 LAB — COMPREHENSIVE METABOLIC PANEL
ALT: 22 U/L (ref 0–44)
AST: 18 U/L (ref 15–41)
Albumin: 4.8 g/dL (ref 3.5–5.0)
Alkaline Phosphatase: 81 U/L (ref 38–126)
Anion gap: 26 — ABNORMAL HIGH (ref 5–15)
BUN: 19 mg/dL (ref 8–23)
CO2: 16 mmol/L — ABNORMAL LOW (ref 22–32)
Calcium: 10.2 mg/dL (ref 8.9–10.3)
Chloride: 94 mmol/L — ABNORMAL LOW (ref 98–111)
Creatinine, Ser: 1.51 mg/dL — ABNORMAL HIGH (ref 0.61–1.24)
GFR, Estimated: 48 mL/min — ABNORMAL LOW (ref >60)
Glucose, Bld: 413 mg/dL — ABNORMAL HIGH (ref 70–99)
Potassium: 4.9 mmol/L (ref 3.5–5.1)
Sodium: 136 mmol/L (ref 135–145)
Total Bilirubin: 2.2 mg/dL — ABNORMAL HIGH (ref 0.3–1.2)
Total Protein: 7.9 g/dL (ref 6.5–8.1)

## 2022-03-09 LAB — LIPASE, BLOOD: Lipase: 62 U/L — ABNORMAL HIGH (ref 11–51)

## 2022-03-09 LAB — CBC WITH DIFFERENTIAL/PLATELET
Abs Immature Granulocytes: 0.05 10*3/uL (ref 0.00–0.07)
Basophils Absolute: 0 10*3/uL (ref 0.0–0.1)
Basophils Relative: 0 %
Eosinophils Absolute: 0 10*3/uL (ref 0.0–0.5)
Eosinophils Relative: 0 %
HCT: 52.5 % — ABNORMAL HIGH (ref 39.0–52.0)
Hemoglobin: 18.2 g/dL — ABNORMAL HIGH (ref 13.0–17.0)
Immature Granulocytes: 0 %
Lymphocytes Relative: 13 %
Lymphs Abs: 1.8 10*3/uL (ref 0.7–4.0)
MCH: 29.8 pg (ref 26.0–34.0)
MCHC: 34.7 g/dL (ref 30.0–36.0)
MCV: 85.9 fL (ref 80.0–100.0)
Monocytes Absolute: 0.5 10*3/uL (ref 0.1–1.0)
Monocytes Relative: 4 %
Neutro Abs: 11.1 10*3/uL — ABNORMAL HIGH (ref 1.7–7.7)
Neutrophils Relative %: 83 %
Platelets: 335 10*3/uL (ref 150–400)
RBC: 6.11 MIL/uL — ABNORMAL HIGH (ref 4.22–5.81)
RDW: 12 % (ref 11.5–15.5)
WBC: 13.5 10*3/uL — ABNORMAL HIGH (ref 4.0–10.5)
nRBC: 0 % (ref 0.0–0.2)

## 2022-03-09 LAB — MAGNESIUM: Magnesium: 2.3 mg/dL (ref 1.7–2.4)

## 2022-03-09 MED ORDER — FLUCONAZOLE 100 MG PO TABS
100.0000 mg | ORAL_TABLET | Freq: Once | ORAL | Status: AC
  Administered 2022-03-09: 100 mg via ORAL
  Filled 2022-03-09: qty 1

## 2022-03-09 MED ORDER — SODIUM CHLORIDE 0.9 % IV BOLUS
1000.0000 mL | Freq: Once | INTRAVENOUS | Status: AC
  Administered 2022-03-09: 1000 mL via INTRAVENOUS

## 2022-03-09 MED ORDER — FLUCONAZOLE 200 MG PO TABS: 200.0000 mg | ORAL_TABLET | Freq: Every day | ORAL | 0 refills | Status: AC

## 2022-03-09 MED ORDER — IOHEXOL 300 MG/ML  SOLN
80.0000 mL | Freq: Once | INTRAMUSCULAR | Status: AC | PRN
  Administered 2022-03-09: 80 mL via INTRAVENOUS

## 2022-03-09 NOTE — ED Provider Notes (Signed)
[facility] EMERGENCY DEPARTMENT Provider Note   CSN: [id number] Arrival date & time: 03/09/22  9628     History  Chief Complaint  Patient presents with   Emesis   Constipation   Hyperglycemia    Craig Young is a 71 y.o. male.  HPI Patient presents via EMS with concern for nausea, vomiting, no bowel movements.  EMS reports no hemodynamic instability.  Last bowel movement was about 5 days ago, this is unusual as he typically has daily defecation. He had some abdominal pain, but currently has none.  He has had no relief in spite of using laxatives. Notably, the patient also only recently resumed taking insulin after approximately 3 months without this medication.  No fever.  Vomiting is not necessarily postprandial, but he has had substantial amounts of emesis over the past few days, has had difficulty taking his medication.  No history of abdominal surgery.    Home Medications Prior to Admission medications   Medication Sig Start Date End Date Taking? Authorizing Provider  fluconazole (DIFLUCAN) 200 MG tablet Take 1 tablet (200 mg total) by mouth daily for 21 days. 03/09/22 03/30/22 Yes Carmin Muskrat, MD  aspirin EC 81 MG tablet Take 81 mg by mouth daily.    [provider]  Blood Glucose Monitoring Suppl (ONETOUCH VERIO IQ SYSTEM) W/DEVICE KIT Check blood sugar once daily as instructed 02/05/14   Oval Linsey, MD  Coenzyme Q10 (COQ-10) 100 MG capsule Take 100 mg by mouth daily.    [provider]  glucose blood (ONETOUCH VERIO) test strip Use as instructed to check blood sugar 3 times per day.  Dx code E11.40. Non insulin dependent 03/21/17   Minus Liberty, MD  hydrochlorothiazide (MICROZIDE) 12.5 MG capsule Take 12.5 mg by mouth daily.    [provider]  Lancets Glory Rosebush ULTRASOFT) lancets Use as instructed 03/21/17   Minus Liberty, MD  liraglutide (VICTOZA) 18 MG/3ML SOPN Inject into the skin.    [provider]  lisinopril (ZESTRIL) 20 MG tablet Take 20 mg by mouth daily.    [provider]  metFORMIN (GLUCOPHAGE) 1000 MG tablet Take 1,000 mg by mouth 2 (two) times daily with a meal.    [provider]  OZEMPIC, 1 MG/DOSE, 4 MG/3ML SOPN  12/22/20   [provider]  rosuvastatin (CRESTOR) 20 MG tablet Take 1 tablet (20 mg total) by mouth daily. 03/21/17   Minus Liberty, MD  tamsulosin (FLOMAX) 0.4 MG CAPS capsule TAKE 2 CAPSULES (0.8 MG TOTAL) BY MOUTH DAILY AFTER BREAKFAST. 05/10/18   Katherine Roan, MD      Allergies    Atorvastatin    Review of Systems   Review of Systems  All other systems reviewed and are negative.  Physical Exam Updated Vital Signs BP (!) 162/116   Pulse 85   Temp 97.8 F (36.6 C) (Oral)   Resp 12   SpO2 100%  Physical Exam Vitals and nursing note reviewed.  Constitutional:      General: He is not in acute distress.    Appearance: He is well-developed.  HENT:     Head: Normocephalic and atraumatic.  Eyes:     Conjunctiva/sclera: Conjunctivae normal.  Cardiovascular:     Rate and Rhythm: Normal rate and regular rhythm.  Pulmonary:     Effort: Pulmonary effort is normal. No respiratory distress.     Breath sounds: No stridor.  Abdominal:     General: There is no distension.  Tenderness: There is no guarding or rebound.  Skin:    General: Skin is warm and dry.  Neurological:     Mental Status: He is alert and oriented to person, place, and time.    ED Results / Procedures / Treatments   Labs (all labs ordered are listed, but only abnormal results are displayed) Labs Reviewed  COMPREHENSIVE METABOLIC PANEL - Abnormal; Notable for the following components:      Result Value   Chloride 94 (*)    CO2 16 (*)    Glucose, Bld 413 (*)    Creatinine, Ser 1.51 (*)    Total Bilirubin 2.2 (*)    GFR, Estimated 48 (*)    Anion gap 26 (*)    All other components within normal limits  LIPASE, BLOOD - Abnormal;  Notable for the following components:   Lipase 62 (*)    All other components within normal limits  CBC WITH DIFFERENTIAL/PLATELET - Abnormal; Notable for the following components:   WBC 13.5 (*)    RBC 6.11 (*)    Hemoglobin 18.2 (*)    HCT 52.5 (*)    Neutro Abs 11.1 (*)    All other components within normal limits  CBG MONITORING, ED - Abnormal; Notable for the following components:   Glucose-Capillary 435 (*)    All other components within normal limits  CBG MONITORING, ED - Abnormal; Notable for the following components:   Glucose-Capillary 396 (*)    All other components within normal limits  MAGNESIUM    EKG EKG Interpretation  Date/Time:  Tuesday Mar 09 2022 09:33:27 EDT Ventricular Rate:  97 PR Interval:  170 QRS Duration: 88 QT Interval:  355 QTC Calculation: 451 R Axis:   27 Text Interpretation: Sinus rhythm unremarkable ecg Confirmed by Carmin Muskrat [phone]) on 03/09/2022 12:22:45 PM  Radiology CT Abdomen Pelvis W Contrast  Result Date: 03/09/2022 CLINICAL DATA:  Nausea/vomiting Bowel obstruction suspected EXAM: CT ABDOMEN AND PELVIS WITH CONTRAST TECHNIQUE: Multidetector CT imaging of the abdomen and pelvis was performed using the standard protocol following bolus administration of intravenous contrast. RADIATION DOSE REDUCTION: This exam was performed according to the departmental dose-optimization program which includes automated exposure control, adjustment of the mA and/or kV according to patient size and/or use of iterative reconstruction technique. CONTRAST:  8m OMNIPAQUE IOHEXOL 300 MG/ML  SOLN COMPARISON:  None Available. FINDINGS: Lower chest: No acute abnormality. Hepatobiliary: Mild hepatic steatosis. There are subcentimeter hepatic cysts noted. Pancreas: Unremarkable. No pancreatic ductal dilatation or surrounding inflammatory changes. Spleen: Normal in size without focal abnormality. Adrenals/Urinary Tract: Adrenal glands are unremarkable. No  hydronephrosis or nephrolithiasis. The bladder is moderately distended. Stomach/Bowel: There is distal esophageal wall thickening and mild adjacent stranding. Stomach is within normal limits.There is no evidence of bowel obstruction. The appendix is normal. There is a moderate colonic stool burden. Vascular/Lymphatic: Area iliac atherosclerosis. No AAA. No lymphadenopathy. Reproductive: Enlarged prostate gland protruding into the posterior bladder wall. Other: Small fat containing inguinal hernias. No bowel containing hernia. No ascites. No free air. Musculoskeletal: No acute osseous abnormality. No suspicious lytic or blastic lesions. Degenerative grade 1 anterolisthesis at L4-L5. Bilateral hip osteoarthritis. IMPRESSION: Distal esophageal wall thickening and adjacent stranding suggesting esophagitis. Moderate colonic stool burden suggesting constipation. No evidence of bowel obstruction. Normal appendix. Enlarged prostate gland. Electronically Signed   By: JMaurine SimmeringM.D.   On: 03/09/2022 12:12    Procedures Procedures    Medications Ordered in ED Medications  sodium chloride 0.9 %  bolus 1,000 mL (0 mLs Intravenous Stopped 03/09/22 1133)  iohexol (OMNIPAQUE) 300 MG/ML solution 80 mL (80 mLs Intravenous Contrast Given 03/09/22 1202)  sodium chloride 0.9 % bolus 1,000 mL (1,000 mLs Intravenous New Bag/Given 03/09/22 1246)  fluconazole (DIFLUCAN) tablet 100 mg (100 mg Oral Given 03/09/22 1302)    ED Course/ Medical Decision Making/ A&P This patient with a Hx of obesity, hypertension, diabetes presents to the ED for concern of nausea, vomiting, lack of bowel movements, this involves an extensive number of treatment options, and is a complaint that carries with it a high risk of complications and morbidity.    The differential diagnosis includes bowel obstruction, dehydration, electrolyte abnormalities, hypomagnesemia, infection   Social Determinants of Health:  Obesity  Additional history  obtained:  Additional history and/or information obtained from EMS, notable for details of transfer as above   After the initial evaluation, orders, including: CT, labs, fluids were initiated.   Patient placed on Cardiac and Pulse-Oximetry Monitors. The patient was maintained on a cardiac monitor.  The cardiac monitored showed an rhythm of 80 sinus normal The patient was also maintained on pulse oximetry. The readings were typically 100% room air normal   On repeat evaluation of the patient improved 3:40 PM Patient sitting up eating, no vomiting, no complaints Lab Tests:  I personally interpreted labs.  The pertinent results include: Hyperglycemia, with mild anion gap, dehydration  Imaging Studies ordered:  I independently visualized and interpreted imaging which showed esophagitis, no bowel obstruction, no mass I agree with the radiologist interpretation   Dispostion / Final MDM:  After consideration of the diagnostic results and the patient's response to treatment, given the substantial improvement patient will follow-up as an outpatient.  This adult male presents with nausea, vomiting in the context of only recently resuming insulin use to control his diabetes.  Here patient is awake, alert, speaking clearly, but with concern for obstruction versus infection, dehydration, versus DKA patient received 2 L fluid resuscitation, antiemetics, had marked improvement with decrease in his glucose level, cessation of his nausea, vomiting, demonstration of p.o. tolerance.  CT without evidence for bowel obstruction, mass, diverticulitis or diverticulosis.  Patient comfortable with discharge, continuing his insulin dosing, following up with primary care and initiating Diflucan.  The hospitalization was a consideration given the patient's initial presentation with nausea, vomiting, lack of bowel movements given his marked improvement, he was discharged in stable condition.  Final Clinical  Impression(s) / ED Diagnoses Final diagnoses:  Hyperglycemia  Esophagitis  Bilious vomiting with nausea    Rx / DC Orders ED Discharge Orders          Ordered    fluconazole (DIFLUCAN) 200 MG tablet  Daily        03/09/22 1540              Carmin Muskrat, MD 03/09/22 1541

## 2022-03-09 NOTE — ED Triage Notes (Signed)
Pt arrived via [facility] from home with c/c of vomiting. Per Ems pt has vomited since 0800 5/22 approximately 15 times. Pt also has complaints of constipation with last bowel movement being last 5/17. Pt has also been going without his insulin for 3 months,  but got it last week.   CBG 412, 210/120 --> 150/90, 97HR, 100%RA 4mg  zofran, 100 ml NSS

## 2022-03-09 NOTE — Discharge Instructions (Addendum)
As discussed, it is very important that you continue taking your insulin as you recently resumed doing.  In addition, take your newly prescribed a Diflucan daily for the next 3 weeks.  If you develop new, or concerning changes do not hesitate to return here or see your physician.

## 2022-03-09 NOTE — ED Notes (Signed)
Got patient undressed on the monitor did ekg shown to Dr Jeraldine Loots patient is resting with call bel in reach

## 2022-03-13 ENCOUNTER — Emergency Department (HOSPITAL_COMMUNITY): Payer: Medicare Other

## 2022-03-13 ENCOUNTER — Encounter (HOSPITAL_COMMUNITY): Payer: Self-pay | Admitting: Emergency Medicine

## 2022-03-13 ENCOUNTER — Other Ambulatory Visit: Payer: Self-pay

## 2022-03-13 ENCOUNTER — Inpatient Hospital Stay (HOSPITAL_COMMUNITY)
Admission: EM | Admit: 2022-03-13 | Discharge: 2022-03-17 | DRG: 637 | Disposition: A | Discharge status: Home Health | Payer: Medicare Other | Attending: Infectious Diseases | Admitting: Infectious Diseases

## 2022-03-13 DIAGNOSIS — K2210 Ulcer of esophagus without bleeding: Secondary | ICD-10-CM | POA: Diagnosis present

## 2022-03-13 DIAGNOSIS — K209 Esophagitis, unspecified without bleeding: Secondary | ICD-10-CM | POA: Diagnosis not present

## 2022-03-13 DIAGNOSIS — N400 Enlarged prostate without lower urinary tract symptoms: Secondary | ICD-10-CM | POA: Diagnosis present

## 2022-03-13 DIAGNOSIS — K2970 Gastritis, unspecified, without bleeding: Secondary | ICD-10-CM | POA: Diagnosis present

## 2022-03-13 DIAGNOSIS — N170 Acute kidney failure with tubular necrosis: Secondary | ICD-10-CM | POA: Diagnosis not present

## 2022-03-13 DIAGNOSIS — E860 Dehydration: Secondary | ICD-10-CM | POA: Diagnosis present

## 2022-03-13 DIAGNOSIS — Z7982 Long term (current) use of aspirin: Secondary | ICD-10-CM

## 2022-03-13 DIAGNOSIS — Z560 Unemployment, unspecified: Secondary | ICD-10-CM | POA: Diagnosis not present

## 2022-03-13 DIAGNOSIS — E876 Hypokalemia: Secondary | ICD-10-CM | POA: Diagnosis present

## 2022-03-13 DIAGNOSIS — E1140 Type 2 diabetes mellitus with diabetic neuropathy, unspecified: Secondary | ICD-10-CM | POA: Diagnosis present

## 2022-03-13 DIAGNOSIS — E785 Hyperlipidemia, unspecified: Secondary | ICD-10-CM | POA: Diagnosis present

## 2022-03-13 DIAGNOSIS — Z79899 Other long term (current) drug therapy: Secondary | ICD-10-CM | POA: Diagnosis not present

## 2022-03-13 DIAGNOSIS — F1410 Cocaine abuse, uncomplicated: Secondary | ICD-10-CM | POA: Diagnosis present

## 2022-03-13 DIAGNOSIS — E870 Hyperosmolality and hypernatremia: Secondary | ICD-10-CM | POA: Diagnosis not present

## 2022-03-13 DIAGNOSIS — Z794 Long term (current) use of insulin: Secondary | ICD-10-CM | POA: Diagnosis not present

## 2022-03-13 DIAGNOSIS — E1110 Type 2 diabetes mellitus with ketoacidosis without coma: Secondary | ICD-10-CM | POA: Diagnosis present

## 2022-03-13 DIAGNOSIS — N179 Acute kidney failure, unspecified: Secondary | ICD-10-CM | POA: Diagnosis present

## 2022-03-13 DIAGNOSIS — F1010 Alcohol abuse, uncomplicated: Secondary | ICD-10-CM | POA: Diagnosis present

## 2022-03-13 DIAGNOSIS — G9341 Metabolic encephalopathy: Secondary | ICD-10-CM | POA: Diagnosis present

## 2022-03-13 DIAGNOSIS — R1319 Other dysphagia: Secondary | ICD-10-CM | POA: Diagnosis not present

## 2022-03-13 DIAGNOSIS — Z7984 Long term (current) use of oral hypoglycemic drugs: Secondary | ICD-10-CM

## 2022-03-13 DIAGNOSIS — K208 Other esophagitis without bleeding: Secondary | ICD-10-CM | POA: Diagnosis not present

## 2022-03-13 DIAGNOSIS — A419 Sepsis, unspecified organism: Principal | ICD-10-CM

## 2022-03-13 DIAGNOSIS — T68XXXA Hypothermia, initial encounter: Secondary | ICD-10-CM

## 2022-03-13 DIAGNOSIS — I10 Essential (primary) hypertension: Secondary | ICD-10-CM | POA: Diagnosis present

## 2022-03-13 DIAGNOSIS — E119 Type 2 diabetes mellitus without complications: Secondary | ICD-10-CM | POA: Diagnosis not present

## 2022-03-13 LAB — BASIC METABOLIC PANEL
Anion gap: 36 — ABNORMAL HIGH (ref 5–15)
Anion gap: 20 — ABNORMAL HIGH (ref 5–15)
Anion gap: 17 — ABNORMAL HIGH (ref 5–15)
BUN: 45 mg/dL — ABNORMAL HIGH (ref 8–23)
BUN: 49 mg/dL — ABNORMAL HIGH (ref 8–23)
BUN: 49 mg/dL — ABNORMAL HIGH (ref 8–23)
CO2: 8 mmol/L — ABNORMAL LOW (ref 22–32)
CO2: 12 mmol/L — ABNORMAL LOW (ref 22–32)
CO2: 15 mmol/L — ABNORMAL LOW (ref 22–32)
Calcium: 10.4 mg/dL — ABNORMAL HIGH (ref 8.9–10.3)
Calcium: 9 mg/dL (ref 8.9–10.3)
Calcium: 10 mg/dL (ref 8.9–10.3)
Chloride: 98 mmol/L (ref 98–111)
Chloride: 111 mmol/L (ref 98–111)
Chloride: 117 mmol/L — ABNORMAL HIGH (ref 98–111)
Creatinine, Ser: 2.73 mg/dL — ABNORMAL HIGH (ref 0.61–1.24)
Creatinine, Ser: 2.03 mg/dL — ABNORMAL HIGH (ref 0.61–1.24)
Creatinine, Ser: 1.92 mg/dL — ABNORMAL HIGH (ref 0.61–1.24)
GFR, Estimated: 24 mL/min — ABNORMAL LOW (ref >60)
GFR, Estimated: 34 mL/min — ABNORMAL LOW (ref >60)
GFR, Estimated: 36 mL/min — ABNORMAL LOW (ref >60)
Glucose, Bld: 799 mg/dL (ref 70–99)
Glucose, Bld: 455 mg/dL — ABNORMAL HIGH (ref 70–99)
Glucose, Bld: 374 mg/dL — ABNORMAL HIGH (ref 70–99)
Potassium: 5 mmol/L (ref 3.5–5.1)
Potassium: 3.9 mmol/L (ref 3.5–5.1)
Potassium: 4 mmol/L (ref 3.5–5.1)
Sodium: 142 mmol/L (ref 135–145)
Sodium: 143 mmol/L (ref 135–145)
Sodium: 149 mmol/L — ABNORMAL HIGH (ref 135–145)

## 2022-03-13 LAB — GLUCOSE, CAPILLARY
Glucose-Capillary: 466 mg/dL — ABNORMAL HIGH (ref 70–99)
Glucose-Capillary: 454 mg/dL — ABNORMAL HIGH (ref 70–99)
Glucose-Capillary: 345 mg/dL — ABNORMAL HIGH (ref 70–99)
Glucose-Capillary: 297 mg/dL — ABNORMAL HIGH (ref 70–99)
Glucose-Capillary: 237 mg/dL — ABNORMAL HIGH (ref 70–99)

## 2022-03-13 LAB — MAGNESIUM: Magnesium: 3.1 mg/dL — ABNORMAL HIGH (ref 1.7–2.4)

## 2022-03-13 LAB — BETA-HYDROXYBUTYRIC ACID
Beta-Hydroxybutyric Acid: >8 mmol/L — ABNORMAL HIGH (ref 0.05–0.27)
Beta-Hydroxybutyric Acid: 7.37 mmol/L — ABNORMAL HIGH (ref 0.05–0.27)

## 2022-03-13 LAB — CBC
HCT: 46.2 % (ref 39.0–52.0)
Hemoglobin: 15.8 g/dL (ref 13.0–17.0)
MCH: 30 pg (ref 26.0–34.0)
MCHC: 34.2 g/dL (ref 30.0–36.0)
MCV: 87.8 fL (ref 80.0–100.0)
Platelets: 301 10*3/uL (ref 150–400)
RBC: 5.26 MIL/uL (ref 4.22–5.81)
RDW: 12.5 % (ref 11.5–15.5)
WBC: 26.3 10*3/uL — ABNORMAL HIGH (ref 4.0–10.5)
nRBC: 0 % (ref 0.0–0.2)

## 2022-03-13 LAB — URINALYSIS, ROUTINE W REFLEX MICROSCOPIC
Bacteria, UA: NONE SEEN
Bilirubin Urine: NEGATIVE
Glucose, UA: >=500 mg/dL — AB
Ketones, ur: 80 mg/dL — AB
Leukocytes,Ua: NEGATIVE
Nitrite: NEGATIVE
Protein, ur: 30 mg/dL — AB
Specific Gravity, Urine: 1.027 (ref 1.005–1.030)
pH: 5 (ref 5.0–8.0)

## 2022-03-13 LAB — I-STAT VENOUS BLOOD GAS, ED
Acid-base deficit: 19 mmol/L — ABNORMAL HIGH (ref 0.0–2.0)
Bicarbonate: 9.2 mmol/L — ABNORMAL LOW (ref 20.0–28.0)
Calcium, Ion: 1.27 mmol/L (ref 1.15–1.40)
HCT: 56 % — ABNORMAL HIGH (ref 39.0–52.0)
Hemoglobin: 19 g/dL — ABNORMAL HIGH (ref 13.0–17.0)
O2 Saturation: 61 %
Potassium: 4.6 mmol/L (ref 3.5–5.1)
Sodium: 140 mmol/L (ref 135–145)
TCO2: 10 mmol/L — ABNORMAL LOW (ref 22–32)
pCO2, Ven: 29.6 mmHg — ABNORMAL LOW (ref 44–60)
pH, Ven: 7.099 — CL (ref 7.25–7.43)
pO2, Ven: 42 mmHg (ref 32–45)

## 2022-03-13 LAB — CBG MONITORING, ED
Glucose-Capillary: >600 mg/dL (ref 70–99)
Glucose-Capillary: >600 mg/dL (ref 70–99)
Glucose-Capillary: >600 mg/dL (ref 70–99)
Glucose-Capillary: 570 mg/dL (ref 70–99)
Glucose-Capillary: >600 mg/dL (ref 70–99)
Glucose-Capillary: 505 mg/dL (ref 70–99)
Glucose-Capillary: 526 mg/dL (ref 70–99)

## 2022-03-13 LAB — CBC WITH DIFFERENTIAL/PLATELET
Abs Immature Granulocytes: 0.47 10*3/uL — ABNORMAL HIGH (ref 0.00–0.07)
Basophils Absolute: 0 10*3/uL (ref 0.0–0.1)
Basophils Relative: 0 %
Eosinophils Absolute: 0 10*3/uL (ref 0.0–0.5)
Eosinophils Relative: 0 %
HCT: 57.8 % — ABNORMAL HIGH (ref 39.0–52.0)
Hemoglobin: 18.8 g/dL — ABNORMAL HIGH (ref 13.0–17.0)
Immature Granulocytes: 3 %
Lymphocytes Relative: 10 %
Lymphs Abs: 1.9 10*3/uL (ref 0.7–4.0)
MCH: 29.7 pg (ref 26.0–34.0)
MCHC: 32.5 g/dL (ref 30.0–36.0)
MCV: 91.2 fL (ref 80.0–100.0)
Monocytes Absolute: 0.9 10*3/uL (ref 0.1–1.0)
Monocytes Relative: 5 %
Neutro Abs: 15.1 10*3/uL — ABNORMAL HIGH (ref 1.7–7.7)
Neutrophils Relative %: 82 %
Platelets: 369 10*3/uL (ref 150–400)
RBC: 6.34 MIL/uL — ABNORMAL HIGH (ref 4.22–5.81)
RDW: 12.6 % (ref 11.5–15.5)
WBC: 18.4 10*3/uL — ABNORMAL HIGH (ref 4.0–10.5)
nRBC: 0 % (ref 0.0–0.2)

## 2022-03-13 LAB — LACTIC ACID, PLASMA
Lactic Acid, Venous: 7 mmol/L (ref 0.5–1.9)
Lactic Acid, Venous: 5.9 mmol/L (ref 0.5–1.9)

## 2022-03-13 LAB — AMMONIA: Ammonia: 41 umol/L — ABNORMAL HIGH (ref 9–35)

## 2022-03-13 LAB — HIV ANTIBODY (ROUTINE TESTING W REFLEX): HIV Screen 4th Generation wRfx: NONREACTIVE

## 2022-03-13 LAB — SALICYLATE LEVEL: Salicylate Lvl: <7 mg/dL — ABNORMAL LOW (ref 7.0–30.0)

## 2022-03-13 LAB — ETHANOL: Alcohol, Ethyl (B): <10 mg/dL (ref <10)

## 2022-03-13 MED ORDER — DEXTROSE IN LACTATED RINGERS 5 % IV SOLN: INTRAVENOUS | Status: DC

## 2022-03-13 MED ORDER — HEPARIN SODIUM (PORCINE) 5000 UNIT/ML IJ SOLN
5000.0000 [IU] | Freq: Three times a day (TID) | INTRAMUSCULAR | Status: DC
  Administered 2022-03-13 – 2022-03-17 (×12): 5000 [IU] via SUBCUTANEOUS
  Filled 2022-03-13 (×11): qty 1

## 2022-03-13 MED ORDER — LACTATED RINGERS IV SOLN: INTRAVENOUS | Status: DC

## 2022-03-13 MED ORDER — POTASSIUM CHLORIDE 10 MEQ/100ML IV SOLN
10.0000 meq | INTRAVENOUS | Status: DC
  Administered 2022-03-13 (×2): 10 meq via INTRAVENOUS
  Filled 2022-03-13 (×3): qty 100

## 2022-03-13 MED ORDER — INSULIN REGULAR(HUMAN) IN NACL 100-0.9 UT/100ML-% IV SOLN
INTRAVENOUS | Status: DC
  Administered 2022-03-13: 11.5 [IU]/h via INTRAVENOUS
  Administered 2022-03-13: 12 [IU]/h via INTRAVENOUS
  Filled 2022-03-13 (×2): qty 100

## 2022-03-13 MED ORDER — FOLIC ACID 1 MG PO TABS
1.0000 mg | ORAL_TABLET | Freq: Every day | ORAL | Status: DC
  Administered 2022-03-14 – 2022-03-16 (×3): 1 mg via ORAL
  Filled 2022-03-13 (×3): qty 1

## 2022-03-13 MED ORDER — DEXTROSE 50 % IV SOLN: 0.0000 mL | INTRAVENOUS | Status: DC | PRN

## 2022-03-13 MED ORDER — METRONIDAZOLE 500 MG/100ML IV SOLN
500.0000 mg | Freq: Once | INTRAVENOUS | Status: AC
  Administered 2022-03-13: 500 mg via INTRAVENOUS
  Filled 2022-03-13: qty 100

## 2022-03-13 MED ORDER — VANCOMYCIN HCL 2000 MG/400ML IV SOLN
2000.0000 mg | Freq: Once | INTRAVENOUS | Status: AC
  Administered 2022-03-13: 2000 mg via INTRAVENOUS
  Filled 2022-03-13: qty 400

## 2022-03-13 MED ORDER — INSULIN REGULAR(HUMAN) IN NACL 100-0.9 UT/100ML-% IV SOLN: INTRAVENOUS | Status: DC

## 2022-03-13 MED ORDER — LACTATED RINGERS IV BOLUS
1000.0000 mL | Freq: Once | INTRAVENOUS | Status: AC
  Administered 2022-03-13: 1000 mL via INTRAVENOUS

## 2022-03-13 MED ORDER — METRONIDAZOLE 500 MG/100ML IV SOLN
500.0000 mg | Freq: Three times a day (TID) | INTRAVENOUS | Status: DC
  Administered 2022-03-13 – 2022-03-14 (×2): 500 mg via INTRAVENOUS
  Filled 2022-03-13 (×2): qty 100

## 2022-03-13 MED ORDER — THIAMINE HCL 100 MG PO TABS
100.0000 mg | ORAL_TABLET | Freq: Every day | ORAL | Status: DC
  Administered 2022-03-15 – 2022-03-17 (×3): 100 mg via ORAL
  Filled 2022-03-13 (×4): qty 1

## 2022-03-13 MED ORDER — ADULT MULTIVITAMIN W/MINERALS CH
1.0000 | ORAL_TABLET | Freq: Every day | ORAL | Status: DC
  Administered 2022-03-14 – 2022-03-16 (×3): 1 via ORAL
  Filled 2022-03-13 (×3): qty 1

## 2022-03-13 MED ORDER — THIAMINE HCL 100 MG/ML IJ SOLN
100.0000 mg | Freq: Every day | INTRAMUSCULAR | Status: DC
  Administered 2022-03-13 – 2022-03-14 (×2): 100 mg via INTRAVENOUS
  Filled 2022-03-13 (×2): qty 2

## 2022-03-13 MED ORDER — SODIUM BICARBONATE 8.4 % IV SOLN
50.0000 meq | Freq: Once | INTRAVENOUS | Status: AC
  Administered 2022-03-13: 50 meq via INTRAVENOUS
  Filled 2022-03-13: qty 50

## 2022-03-13 MED ORDER — LORAZEPAM 1 MG PO TABS: 1.0000 mg | ORAL_TABLET | ORAL | Status: AC | PRN

## 2022-03-13 MED ORDER — LORAZEPAM 2 MG/ML IJ SOLN: 1.0000 mg | INTRAMUSCULAR | Status: AC | PRN

## 2022-03-13 MED ORDER — VANCOMYCIN VARIABLE DOSE PER UNSTABLE RENAL FUNCTION (PHARMACIST DOSING): Status: DC

## 2022-03-13 MED ORDER — SODIUM CHLORIDE 0.9 % IV BOLUS
1000.0000 mL | Freq: Once | INTRAVENOUS | Status: AC
Start: 2022-03-13 — End: 2022-03-13
  Administered 2022-03-13: 1000 mL via INTRAVENOUS

## 2022-03-13 MED ORDER — SODIUM CHLORIDE 0.45 % IV SOLN
INTRAVENOUS | Status: AC
Start: 2022-03-13 — End: 2022-03-14
  Administered 2022-03-13: 200 mL via INTRAVENOUS

## 2022-03-13 MED ORDER — LACTATED RINGERS IV BOLUS: 20.0000 mL/kg | Freq: Once | INTRAVENOUS | Status: DC

## 2022-03-13 MED ORDER — VANCOMYCIN HCL IN DEXTROSE 1-5 GM/200ML-% IV SOLN
1000.0000 mg | Freq: Once | INTRAVENOUS | Status: DC
  Filled 2022-03-13: qty 200

## 2022-03-13 MED ORDER — SODIUM CHLORIDE 0.9 % IV SOLN
2.0000 g | Freq: Once | INTRAVENOUS | Status: AC
  Administered 2022-03-13: 2 g via INTRAVENOUS
  Filled 2022-03-13: qty 12.5

## 2022-03-13 MED ORDER — SODIUM CHLORIDE 0.9 % IV SOLN: 2.0000 g | INTRAVENOUS | Status: DC

## 2022-03-13 NOTE — ED Notes (Signed)
Pt going to 4E23. Thanks

## 2022-03-13 NOTE — ED Provider Notes (Signed)
[facility] EMERGENCY DEPARTMENT Provider Note   CSN: [id number] Arrival date & time: 03/13/22  1147     History  Chief Complaint  Patient presents with   Weakness    Craig Young is a 71 y.o. male.  71 year old male brought in by EMS from home with report of generalized weakness, recently diagnosed with a throat infection with decreased p.o. intake.  Patient states that his sister is a Marine scientist, he wanted to go home but she insisted that she stay with her.  Patient states that he did take his medication today, feels generally weak and without specific complaints.  He denies anything that is painful, reports that he did void this morning however with a small amount.      Home Medications Prior to Admission medications   Medication Sig Start Date End Date Taking? Authorizing Provider  aspirin EC 81 MG tablet Take 81 mg by mouth daily.   Yes [provider]  Coenzyme Q10 (COQ-10) 100 MG capsule Take 100 mg by mouth daily.   Yes [provider]  fluconazole (DIFLUCAN) 200 MG tablet Take 1 tablet (200 mg total) by mouth daily for 21 days. 03/09/22 03/30/22 Yes Carmin Muskrat, MD  hydrochlorothiazide (MICROZIDE) 12.5 MG capsule Take 12.5 mg by mouth daily.   Yes [provider]  lisinopril (ZESTRIL) 20 MG tablet Take 20 mg by mouth daily.   Yes [provider]  metFORMIN (GLUCOPHAGE) 1000 MG tablet Take 1,000 mg by mouth 2 (two) times daily with a meal.   Yes [provider]  OZEMPIC, 1 MG/DOSE, 4 MG/3ML SOPN Inject 1 mg into the muscle once a week. Mondays 12/22/20  Yes [provider]  rosuvastatin (CRESTOR) 20 MG tablet Take 1 tablet (20 mg total) by mouth daily. 03/21/17  Yes Minus Liberty, MD  tamsulosin (FLOMAX) 0.4 MG CAPS capsule TAKE 2 CAPSULES (0.8 MG TOTAL) BY MOUTH DAILY AFTER BREAKFAST. 05/10/18  Yes Winfrey, Jenne Pane, MD  Blood Glucose Monitoring Suppl (ONETOUCH VERIO IQ SYSTEM) W/DEVICE KIT Check  blood sugar once daily as instructed 02/05/14   Oval Linsey, MD  glucose blood (ONETOUCH VERIO) test strip Use as instructed to check blood sugar 3 times per day.  Dx code E11.40. Non insulin dependent 03/21/17   Minus Liberty, MD  Lancets [facility] ULTRASOFT) lancets Use as instructed 03/21/17   Minus Liberty, MD      Allergies    Atorvastatin    Review of Systems   Review of Systems Negative except as per HPI Physical Exam Updated Vital Signs BP 130/75   Pulse 99   Temp (!) 94.3 F (34.6 C)   Resp (!) 29   Ht 6' (1.829 m)   Wt 104.3 kg   SpO2 100%   BMI 31.19 kg/m  Physical Exam Vitals and nursing note reviewed.  Constitutional:      General: He is not in acute distress.    Appearance: He is well-developed. He is not diaphoretic.  HENT:     Head: Normocephalic and atraumatic.     Nose: Nose normal.     Mouth/Throat:     Mouth: Mucous membranes are dry.  Eyes:     Conjunctiva/sclera: Conjunctivae normal.  Cardiovascular:     Rate and Rhythm: Normal rate and regular rhythm.     Pulses: Normal pulses.     Heart sounds: Normal heart sounds.  Pulmonary:     Effort: Pulmonary effort is normal.     Breath sounds: Normal  breath sounds.  Abdominal:     Palpations: Abdomen is soft.     Tenderness: There is no abdominal tenderness.  Musculoskeletal:     Right lower leg: No edema.     Left lower leg: No edema.  Skin:    General: Skin is warm and dry.     Findings: No erythema or rash.  Neurological:     Mental Status: He is alert and oriented to person, place, and time.  Psychiatric:        Behavior: Behavior normal.    ED Results / Procedures / Treatments   Labs (all labs ordered are listed, but only abnormal results are displayed) Labs Reviewed  CBC WITH DIFFERENTIAL/PLATELET - Abnormal; Notable for the following components:      Result Value   WBC 18.4 (*)    RBC 6.34 (*)    Hemoglobin 18.8 (*)    HCT 57.8 (*)    Neutro Abs 15.1 (*)    Abs  Immature Granulocytes 0.47 (*)    All other components within normal limits  COMPREHENSIVE METABOLIC PANEL - Abnormal; Notable for the following components:   Sodium 147 (*)    Potassium 2.5 (*)    Chloride 127 (*)    CO2 >45 (*)    Glucose, Bld 427 (*)    BUN 25 (*)    Calcium 4.8 (*)    Total Protein 3.3 (*)    Albumin 1.8 (*)    AST 10 (*)    All other components within normal limits  URINALYSIS, ROUTINE W REFLEX MICROSCOPIC - Abnormal; Notable for the following components:   Glucose, UA >=500 (*)    Hgb urine dipstick MODERATE (*)    Ketones, ur 80 (*)    Protein, ur 30 (*)    All other components within normal limits  LACTIC ACID, PLASMA - Abnormal; Notable for the following components:   Lactic Acid, Venous 7.0 (*)    All other components within normal limits  BASIC METABOLIC PANEL - Abnormal; Notable for the following components:   CO2 8 (*)    Glucose, Bld 799 (*)    BUN 45 (*)    Creatinine, Ser 2.73 (*)    Calcium 10.4 (*)    GFR, Estimated 24 (*)    Anion gap 36 (*)    All other components within normal limits  BETA-HYDROXYBUTYRIC ACID - Abnormal; Notable for the following components:   Beta-Hydroxybutyric Acid >8.00 (*)    All other components within normal limits  MAGNESIUM - Abnormal; Notable for the following components:   Magnesium 3.1 (*)    All other components within normal limits  CBG MONITORING, ED - Abnormal; Notable for the following components:   Glucose-Capillary >600 (*)    All other components within normal limits  CBG MONITORING, ED - Abnormal; Notable for the following components:   Glucose-Capillary >600 (*)    All other components within normal limits  I-STAT VENOUS BLOOD GAS, ED - Abnormal; Notable for the following components:   pH, Ven 7.099 (*)    pCO2, Ven 29.6 (*)    Bicarbonate 9.2 (*)    TCO2 10 (*)    Acid-base deficit 19.0 (*)    HCT 56.0 (*)    Hemoglobin 19.0 (*)    All other components within normal limits  CBG  MONITORING, ED - Abnormal; Notable for the following components:   Glucose-Capillary >600 (*)    All other components within normal limits  CULTURE, BLOOD (ROUTINE  X 2)  CULTURE, BLOOD (ROUTINE X 2)  LACTIC ACID, PLASMA  BASIC METABOLIC PANEL  BASIC METABOLIC PANEL  BASIC METABOLIC PANEL  BETA-HYDROXYBUTYRIC ACID  AMMONIA    EKG None  Radiology DG Chest Port 1 View  Result Date: 03/13/2022 CLINICAL DATA:  Weakness, hypothermia EXAM: PORTABLE CHEST 1 VIEW COMPARISON:  None Available. FINDINGS: The heart size and mediastinal contours are within normal limits. Both lungs are clear. The visualized skeletal structures are unremarkable. IMPRESSION: No active disease. Electronically Signed   By: Keane Police D.O.   On: 03/13/2022 13:33    Procedures .Critical Care Performed by: Tacy Learn, PA-C Authorized by: Tacy Learn, PA-C   Critical care provider statement:    Critical care time (minutes):  30   Critical care was time spent personally by me on the following activities:  Development of treatment plan with patient or surrogate, discussions with consultants, evaluation of patient's response to treatment, examination of patient, ordering and review of laboratory studies, ordering and review of radiographic studies, ordering and performing treatments and interventions, pulse oximetry, re-evaluation of patient's condition and review of old charts    Medications Ordered in ED Medications  lactated ringers infusion ( Intravenous New Bag/Given 03/13/22 1334)  dextrose 5 % in lactated ringers infusion (0 mLs Intravenous Hold 03/13/22 1335)  dextrose 50 % solution 0-50 mL (has no administration in time range)  insulin regular, human (MYXREDLIN) 100 units/ 100 mL infusion (11.5 Units/hr Intravenous New Bag/Given 03/13/22 1530)  vancomycin (VANCOREADY) IVPB 2000 mg/400 mL (2,000 mg Intravenous New Bag/Given 03/13/22 1542)  vancomycin variable dose per unstable renal function (pharmacist  dosing) (has no administration in time range)  ceFEPIme (MAXIPIME) 2 g in sodium chloride 0.9 % 100 mL IVPB (has no administration in time range)  sodium chloride 0.9 % bolus 1,000 mL (0 mLs Intravenous Stopped 03/13/22 1438)  ceFEPIme (MAXIPIME) 2 g in sodium chloride 0.9 % 100 mL IVPB (0 g Intravenous Stopped 03/13/22 1438)  metroNIDAZOLE (FLAGYL) IVPB 500 mg (0 mg Intravenous Stopped 03/13/22 1520)  lactated ringers bolus 1,000 mL (1,000 mLs Intravenous New Bag/Given 03/13/22 1438)  lactated ringers bolus 1,000 mL (1,000 mLs Intravenous New Bag/Given 03/13/22 1524)  sodium bicarbonate injection 50 mEq (50 mEq Intravenous Given 03/13/22 1517)    ED Course/ Medical Decision Making/ A&P                           Medical Decision Making Amount and/or Complexity of Data Reviewed Labs: ordered. Radiology: ordered.  Risk Prescription drug management. Decision regarding hospitalization.   This patient presents to the ED for concern of generalized weakness, this involves an extensive number of treatment options, and is a complaint that carries with it a high risk of complications and morbidity.  The differential diagnosis includes but not limited to sepsis, hyperglycemia, DKA, AKI, metabolic abnormality, encephalopathy   Co morbidities that complicate the patient evaluation  Diabetes, hypertension, hyperlipidemia   Additional history obtained:  External records from outside source obtained and reviewed including recent visit to the ER on 03/09/2022, diagnosed with gastritis and treated with fluconazole, labs without significant changes at that time   Lab Tests:  I Ordered, and personally interpreted labs.  The pertinent results include: CBC with elevated white count at 18,000 with increase in neutrophils.  Lactic acid elevated at 7.  Urinalysis positive for glucose, hemoglobin, ketones and protein, negative for UTI.  Initial chemistry results with sodium of 147,  corrects to 155; bicarb  greater than 45, glucose 427 with anion gap not calculated.  Potassium was found to be 2.5 without T wave changes on EKG.  These results seemed unusual and warranted repeat before further action was taken.  Repeat metabolic panel with glucose of 799, potassium normal at 5.0, sodium 142 which corrects to 153.  Glucose 799 with bicarb of 8 and gap of 36.  AKI with creatinine of 2.73 with GFR of 24.  Pharmacy was updated with labs for dosing purposes and DKA management.  Beta hydroxybutyric acid is elevated at greater than 8.  Magnesium slightly elevated at 3.1.  VBG with pH of 7.009.   Imaging Studies ordered:  I ordered imaging studies including chest x-ray I independently visualized and interpreted imaging which showed no acute disease I agree with the radiologist interpretation   Cardiac Monitoring: / EKG:  The patient was maintained on a cardiac monitor.  I personally viewed and interpreted the cardiac monitored which showed an underlying rhythm of: sinus tachycardia, rate 101   Consultations Obtained:  I requested consultation with the ER attending Dr. Dina Rich,  and discussed lab and imaging findings as well as pertinent plan - they recommend: Repeat chemistry, hold off on fluids due to concerns for profound hyper natremia, plan for IV K, unable to give insulin at this time due to patient's significant hypokalemia. ICU Dr. Darlen Round, NP with ICU team who has seen the patient, feels patient is significantly dehydrated, will order IV fluids and write consult note, feel patient is appropriate for admission to medicine service.   Problem List / ED Course / Critical interventions / Medication management  71 year old male brought in by EMS with concern for generalized weakness.  Patient is found to be hypothermic on arrival which prompted undifferentiated sepsis work-up.  White count returns elevated with predominantly neutrophils, elevated glucose with concern for hypernatremia, hypokalemia,  DKA with lactic of 7.  Patient was given broad-spectrum antibiotics.  Decision was made to repeat patient's metabolic panel, treat his hypokalemia and hold off on IV fluids pending repeat labs.  Patient's repeat metabolic panel returns with AKI, DKA, sodium somewhat improved although elevated still on correction.  Temp Foley placed for monitoring patient's temperature and urinary output patient was discussed with critical care who has seen the patient and feels patient needs IV fluid resuscitation (ICU team to order this), will write a note, felt stable for hospitalist team to admit.  Patient has become somewhat more confused during his ER stay.  Internal medicine service was paged for consult and will see the patient. I ordered medication including IV fluids, potassium for dehydration, DKA, hypokalemia which was found to be erroneous on repeat lab results and potassium was discontinued. Reevaluation of the patient after these medicines showed that the patient worsened I have reviewed the patients home medicines and have made adjustments as needed   Social Determinants of Health:  Currently living with sister   Test / Admission - Considered:  Plan is to admit for further monitoring and treatment         Final Clinical Impression(s) / ED Diagnoses Final diagnoses:  Sepsis with acute renal failure without septic shock, due to unspecified organism, unspecified acute renal failure type ([place])  Hypothermia, initial encounter  AKI (acute kidney injury) ([place])  Diabetic ketoacidosis without coma associated with type 2 diabetes mellitus ([place])    Rx / DC Orders ED Discharge Orders     None  Tacy Learn, PA-C 03/13/22 1604    Horton, Alvin Critchley, DO 03/13/22 1705

## 2022-03-13 NOTE — Hospital Course (Signed)
Craig Young is a 71 y.o.male with a history of hypertension, hyperlipidemia, uncontrolled type 2 diabetes who was presented to [facility] for poor p.o. intake, nausea, abdominal pain and admitted for DKA. Hospital course is detailed below:  DKA Patient on admission had significant anion gap metabolic acidosis as well as lactic acidosis.  Patient had significant blurry vision, elevated anion gap, elevated ketoacidosis, bicarb 9, pH 7.099, and Sodium 159.  DKA suspected to be secondary to regular cocaine/alcohol use, uncontrolled type 2 diabetes, and lack of diabetes medications.  Patient was started on Endo tool and appropriately discontinued from this when anion gap was closed x2.  Patient was placed back on long-acting insulin as well as monitored CBGs closely.  Sepsis Patient on admission was tachypneic and hypothermia down to 94 F.  Bair hugger was initiated to increase patient's body temperature.  Patient also had leukocytosis and lactic acidosis.  Patient was started on cefepime and vancomycin while blood cultures are pending.  Blood cultures showed***.  Patient was de-escalated from vancomycin and cefepime to***.  Patient was never febrile and blood pressure remained elevated and hospitalization.  #Acute Kidney Injury Patient's admission creatinine was 2.73.  This was rapidly improved to 1.23 after receiving IVF.   #Candida esophagitis Patient was resumed on his 21-day course of fluconazole once patient was unable to tolerate p.o. intake and off Endo tool.  Other chronic conditions were medically managed with home medications and formulary alternatives as necessary (hypertension, hyperlipidemia, cocaine use disorder, BPH)  PCP Follow-up Recommendations:   No N/V. No abd pain, last bowel movement a week and a half ago.   Not eating much. Feels like food gets stuck in his esophagus.   Feeling like food gets stuck in his chest. For the whole month.   HH PT.

## 2022-03-13 NOTE — TOC Initial Note (Addendum)
Transition of Care [facility]) - Initial/Assessment Note    Patient Details  Name: Craig Young MRN: [id number] Date of Birth: 23-Mar-1951  [facility]) CM/SW Contact:    Lockie Pares, RN Phone Number: 03/13/2022, 1:26 PM  Clinical Narrative:                  71 Yo male lives alone, has nephew listed as NOK but he does not come over much. Patient states he feels like he does "fine" at home. Has no DME, discussed DME and HH, he states again " I do ok" asked about his insulin regimen. He stated at first he takes it every month, then he says he takes it every week on Monday. Asked about his sight " I see ok" then he states I cannot see well at times" He takes Victoza weekly.   Will need to follow up  when patient more lucid. PT consult placed for evaluation.   CM will follow for needs, recommendations, and transitions.  Expected Discharge Plan: Home w Home Health Services Barriers to Discharge:  (patient stating he does not need it currently)   Patient Goals and CMS Choice     Choice offered to / list presented to : Patient  Expected Discharge Plan and Services Expected Discharge Plan: Home w Home Health Services   Discharge Planning Services: CM Consult   Living arrangements for the past 2 months: Single Family Home                                      Prior Living Arrangements/Services Living arrangements for the past 2 months: Single Family Home Lives with:: Self Patient language and need for interpreter reviewed:: Yes Do you feel safe going back to the place where you live?: Yes      Need for Family Participation in Patient Care: Yes (Comment) Care giver support system in place?: Yes (comment)   Criminal Activity/Legal Involvement Pertinent to Current Situation/Hospitalization: No - Comment as needed  Activities of Daily Living      Permission Sought/Granted                  Emotional Assessment Appearance:: Appears stated  age Attitude/Demeanor/Rapport: Lethargic Affect (typically observed): Flat Orientation: : Fluctuating Orientation (Suspected and/or reported Sundowners) (slightly confused, slurring words)   Psych Involvement: No (comment)  Admission diagnosis:  General weakness Patient Active Problem List   Diagnosis Date Noted   Erectile dysfunction due to diseases classified elsewhere 03/21/2017   Benign prostatic hyperplasia with nocturia 12/06/2016   Alcohol consuption of more than two drinks per day 12/06/2016   Tremor 12/06/2016   Healthcare maintenance 03/01/2014   Type 2 diabetes, uncontrolled, with neuropathy 02/05/2014   HTN (hypertension), benign 02/05/2014   Hyperlipidemia 02/05/2014   PCP:  Karl Ito, DO Pharmacy:   [id number] - [place], [place] - [address] AT [facility] OF [address] [place] [address] Phone: [phone] Fax: [phone]  [id number] [place], [place] - [address] [place] [address] Phone: [phone] Fax: [phone]     Social Determinants of Health (SDOH) Interventions    Readmission Risk Interventions     View : No data to display.

## 2022-03-13 NOTE — Progress Notes (Signed)
Pharmacy Antibiotic Note  Craig Young is a 71 y.o. male admitted on 03/13/2022 presenting with generalized weakness, recent throat infection, concern for sepsis.  Pharmacy has been consulted for vancomycin and cefepime dosing.  Plan: Vancomycin 2g IV x 1, then variable dosing due to unstable renal function Cefepime 2g IV q 24 hours Monitor renal function, Cx and clinical progression to narrow Vancomycin levels as needed  Height: 6' (182.9 cm) Weight: 104.3 kg (230 lb) IBW/kg (Calculated) : 77.6  Temp (24hrs), Avg:94.7 F (34.8 C), Min:93.8 F (34.3 C), Max:95.5 F (35.3 C)  Recent Labs  Lab 03/09/22 1021 03/13/22 1238  WBC 13.5* 18.4*  CREATININE 1.51* 1.24  LATICACIDVEN  --  7.0*    Estimated Creatinine Clearance: 66.3 mL/min (by C-G formula based on SCr of 1.24 mg/dL).    Allergies  Allergen Reactions   Atorvastatin Other (See Comments)    Myalgias/cramps    Daylene Posey, PharmD Clinical Pharmacist ED Pharmacist Phone # [phone] 03/13/2022 2:18 PM

## 2022-03-13 NOTE — Consult Note (Signed)
NAME:  Craig Young, MRN:  [id number], DOB:  [birth year], LOS: 0 ADMISSION DATE:  03/13/2022, CONSULTATION DATE:  03/13/22 REFERRING MD:  Percell Miller -- EM , CHIEF COMPLAINT:  weakness + abnormal labs    History of Present Illness:  71 yo M hx poorly controlled DM, HTN who presented to ED 5/27 for weakness. Pt was recently seen in ED 5/23 for n/v/ improved abdominal pain. Labs most notable for hyperglycemia. He  had abdominal imaging revealing esophagitis. He was given IVF and dc home in stable condition with diflucan and instructions for PCP follow up.  Presented back to ED 5/27 with weakness. He was hypothermic on arrival which raised concern for sepsis, but after CMP resulted with mild hypernatremia further resuscitation was deferred. There has not been correction of hyperglycemia due to hypokalemia. ED labs -- Na 147 K 2.5, Glu 427, LA 7, WBC 18  PCCM consulted in this setting   In conversation w pt, he has not been eating and drinking normally since leaving the ED, and in chart review had very poor PO intake + vomiting preceding that ED presentation. He is complaining of feeling very thirsty and like he "has no volume"  No abdominal pain.   Pertinent  Medical History   DM2 HTN   Significant Hospital Events: Including procedures, antibiotic start and stop dates in addition to other pertinent events   5/27 weakness --> Ed presentation. Abnormal labs, PCCM consult   Interim History / Subjective:  Has recently been helped back in bed by staff.   Objective   Blood pressure (!) 127/97, pulse (!) 103, temperature (!) 93.8 F (34.3 C), resp. rate (!) 34, height 6' (1.829 m), weight 104.3 kg, SpO2 100 %.        Intake/Output Summary (Last 24 hours) at 03/13/2022 1446 Last data filed at 03/13/2022 1346 Gross per 24 hour  Intake --  Output 200 ml  Net -200 ml   Filed Weights   03/13/22 1150  Weight: 104.3 kg    Examination: General: Chronically and acutely ill appearing older  adult M supine NAD HENT: NCAT. Dry mm. Acetone breath  Lungs: Incr RR. Symmetrical chest expansion. Clear Cardiovascular: tachycardic cap refill brisk  Abdomen: soft ndnt + bowel sounds  Extremities: no acute joint deformity  Neuro: Awake following commands. Lethargic  GU: defer   Resolved Hospital Problem list     Assessment & Plan:   Acute metabolic encephalopathy due to dehydration, and hyperglycemic crisis  Hx poorly controlled DM2 with hyperglycemia, glucosuria  Dehydration Lactic acidosis Metabolic acidosis Leukocytosis  Hypovolemic hypernatremia Hypokalemia  Malnutrition  Weakness  Recent esophagitis  -sepsis possible but less likely. CXR clear. Think LA, leukocytosis, metabolic acidosis all driven by hyperglycemic process + dehydration. VBG with ph 7.1 and low bicarb  P -repeat CMP is pending which could augment below plan, but needs significant volume resuscitation. Would anticipate that this repeat CMP likely will have worse renal fxn and ongoing significant hyperglycemia  -I have ordered for additional 2L LR bolus, may need additional bolus and certainly  -after getting more volume, will need insulin for significant hyperglycemiawould need mIVF. Would wait for repeat CMP to result to know where K is now  -expect mild hyponatremia to improve w IVF  -from acidosis on VBG, will also order an amp of bicarb -needs aggressive K repletion. Should check mag -check beta hydroxybutyric acid  -tachypnea is compensatory, is not tiring out at the moment so would allow this  -trend LA  and follow micro data  Thank you for consulting PCCM. The patient does not require ICU level of care at this time and we will sign off. Please re-engage if pt clinical status changes or if we can be of further assistance, happy to re-evaluate if needed    Best Practice (right click and "Reselect all SmartList Selections" daily)   Diet/type: Ice chips  DVT prophylaxis: per primary  GI  prophylaxis: per primary Lines: N/A Foley:  N/A Code Status:  full code Last date of multidisciplinary goals of care discussion [--]  Labs   CBC: Recent Labs  Lab 03/09/22 1021 03/13/22 1238 03/13/22 1406  WBC 13.5* 18.4*  --   NEUTROABS 11.1* 15.1*  --   HGB 18.2* 18.8* 19.0*  HCT 52.5* 57.8* 56.0*  MCV 85.9 91.2  --   PLT 335 369  --     Basic Metabolic Panel: Recent Labs  Lab 03/09/22 1021 03/13/22 1238 03/13/22 1406  NA 136 147* 140  K 4.9 2.5* 4.6  CL 94* 127*  --   CO2 16* >45*  --   GLUCOSE 413* 427*  --   BUN 19 25*  --   CREATININE 1.51* 1.24  --   CALCIUM 10.2 4.8*  --   MG 2.3  --   --    GFR: Estimated Creatinine Clearance: 66.3 mL/min (by C-G formula based on SCr of 1.24 mg/dL). Recent Labs  Lab 03/09/22 1021 03/13/22 1238  WBC 13.5* 18.4*  LATICACIDVEN  --  7.0*    Liver Function Tests: Recent Labs  Lab 03/09/22 1021 03/13/22 1238  AST 18 10*  ALT 22 10  ALKPHOS 81 38  BILITOT 2.2* 0.8  PROT 7.9 3.3*  ALBUMIN 4.8 1.8*   Recent Labs  Lab 03/09/22 1021  LIPASE 62*   No results for input(s): AMMONIA in the last 168 hours.  ABG    Component Value Date/Time   HCO3 9.2 (L) 03/13/2022 1406   TCO2 10 (L) 03/13/2022 1406   ACIDBASEDEF 19.0 (H) 03/13/2022 1406   O2SAT 61 03/13/2022 1406     Coagulation Profile: No results for input(s): INR, PROTIME in the last 168 hours.  Cardiac Enzymes: No results for input(s): CKTOTAL, CKMB, CKMBINDEX, TROPONINI in the last 168 hours.  HbA1C: Hemoglobin A1C  Date/Time Value Ref Range Status  01/16/2018 02:03 PM 7.0  Final  08/01/2017 01:31 PM 7.6  Final    CBG: Recent Labs  Lab 03/09/22 1018 03/09/22 1253 03/13/22 1311  GLUCAP 435* 396* >600*    Review of Systems:   Review of Systems  Constitutional:  Positive for malaise/fatigue.  HENT: Negative.    Eyes: Negative.   Respiratory: Negative.    Cardiovascular: Negative.   Gastrointestinal:  Positive for nausea and vomiting.  Negative for abdominal pain.  Genitourinary:  Positive for dysuria.  Musculoskeletal:  Positive for myalgias.  Skin: Negative.   Neurological: Negative.   Endo/Heme/Allergies:  Positive for polydipsia.  Psychiatric/Behavioral: Negative.      Past Medical History:  He,  has a past medical history of HTN (hypertension), benign, Hyperlipidemia, and Type 2 diabetes, uncontrolled, with neuropathy.   Surgical History:  History reviewed. No pertinent surgical history.   Social History:   reports that he has never smoked. He has never used smokeless tobacco. He reports current alcohol use. He reports that he does not use drugs.   Family History:  His family history is not on file.   Allergies Allergies  Allergen Reactions  Atorvastatin Other (See Comments)    Myalgias/cramps     Home Medications  Prior to Admission medications   Medication Sig Start Date End Date Taking? Authorizing Provider  aspirin EC 81 MG tablet Take 81 mg by mouth daily.    [provider]  Blood Glucose Monitoring Suppl (ONETOUCH VERIO IQ SYSTEM) W/DEVICE KIT Check blood sugar once daily as instructed 02/05/14   Oval Linsey, MD  Coenzyme Q10 (COQ-10) 100 MG capsule Take 100 mg by mouth daily.    [provider]  fluconazole (DIFLUCAN) 200 MG tablet Take 1 tablet (200 mg total) by mouth daily for 21 days. 03/09/22 03/30/22  Carmin Muskrat, MD  glucose blood [facility] VERIO) test strip Use as instructed to check blood sugar 3 times per day.  Dx code E11.40. Non insulin dependent 03/21/17   Minus Liberty, MD  hydrochlorothiazide (MICROZIDE) 12.5 MG capsule Take 12.5 mg by mouth daily.    [provider]  Lancets Glory Rosebush ULTRASOFT) lancets Use as instructed 03/21/17   Minus Liberty, MD  liraglutide (VICTOZA) 18 MG/3ML SOPN Inject into the skin.    [provider]  lisinopril (ZESTRIL) 20 MG tablet Take 20 mg by mouth daily.    [provider]  metFORMIN  (GLUCOPHAGE) 1000 MG tablet Take 1,000 mg by mouth 2 (two) times daily with a meal.    [provider]  OZEMPIC, 1 MG/DOSE, 4 MG/3ML SOPN  12/22/20   [provider]  rosuvastatin (CRESTOR) 20 MG tablet Take 1 tablet (20 mg total) by mouth daily. 03/21/17   Minus Liberty, MD  tamsulosin (FLOMAX) 0.4 MG CAPS capsule TAKE 2 CAPSULES (0.8 MG TOTAL) BY MOUTH DAILY AFTER BREAKFAST. 05/10/18   Katherine Roan, MD     Critical care time: 40 min     CRITICAL CARE Performed by: Cristal Generous   Total critical care time: 40 minutes   Critical care was necessary to treat or prevent imminent or life-threatening deterioration.  Critical care was time spent personally by me on the following activities: development of treatment plan with patient and/or surrogate as well as nursing, discussions with consultants, evaluation of patient's response to treatment, examination of patient, obtaining history from patient or surrogate, ordering and performing treatments and interventions, ordering and review of laboratory studies, ordering and review of radiographic studies, pulse oximetry and re-evaluation of patient's condition. Critical care time was exclusive of separately billable procedures and treating other patients.  Eliseo Gum MSN, AGACNP-BC [place] for pager  03/13/2022, 3:11 PM

## 2022-03-13 NOTE — ED Triage Notes (Signed)
Per [facility] pt coming from home c/o generalized weakness over the past month. States recently diagnosed with a throat infection so he is unable to eat or drink.

## 2022-03-13 NOTE — Progress Notes (Signed)
[facility] following for sepsis protocol. 

## 2022-03-13 NOTE — H&P (Cosign Needed Addendum)
Date: 03/13/2022               Patient Name:  Craig Young MRN: [id number]  DOB: 01-02-1951 Age / Sex: 71 y.o., male   PCP: Andree Moro, DO              Medical Service: Internal Medicine Teaching Service              Attending Physician: Dr. Aldine Contes, MD    First Contact: Hartley Barefoot, MS4 Pager: [phone]  Second Contact: Dr. France Ravens Pager: [phone]  Third Contact Dr. Rick Duff Pager: [phone]       After Hours (After 5p/  First Contact Pager: [phone]  weekends / holidays): Second Contact Pager: [phone]   Chief Complaint: He had an infection in his throat. He also has acid reflux.   History of Present Illness:  He recently came to the ED for a throat infection (03/09/22 according to the chart). He felt it at the top of his throat - he couldn't eat anything. It was hurting for 2 weeks, then felt that the infection had moved down into his chest. He started throwing up after that. He was told that he had a fungus infection in his throat and chest. He was prescribed fluconazole, which he has been taking. The pain has felt better.  He was getting his diabetes medication Ozempic, but he has been out of it for 3 months. He was feeling OK for the past 3 months.   His eyesight has become blurry, which caused him to know that his sugar is up.  Since then, he has been feeling like he has to throw up. It is usually that when he has to lie on his back. He has only had 2 ensures in the past 4 days, and two apple sauces. He has been drinking 1 gatorade a day, no water. He has felt weak.  He has been vomiting as well.  He has been urinating less. He normally urinates every 2 hours, "like a race horse."  Right now, he is still having pain in his stomach.  [place], [place] [phone]):   Meds:  Current Meds  Medication Sig   aspirin EC 81 MG tablet Take 81 mg by mouth daily.   Coenzyme Q10 (COQ-10) 100 MG capsule Take 100 mg by mouth daily.   fluconazole  (DIFLUCAN) 200 MG tablet Take 1 tablet (200 mg total) by mouth daily for 21 days.   hydrochlorothiazide (MICROZIDE) 12.5 MG capsule Take 12.5 mg by mouth daily.   lisinopril (ZESTRIL) 20 MG tablet Take 20 mg by mouth daily.   metFORMIN (GLUCOPHAGE) 1000 MG tablet Take 1,000 mg by mouth 2 (two) times daily with a meal.   OZEMPIC, 1 MG/DOSE, 4 MG/3ML SOPN Inject 1 mg into the muscle once a week. Mondays   rosuvastatin (CRESTOR) 20 MG tablet Take 1 tablet (20 mg total) by mouth daily.   tamsulosin (FLOMAX) 0.4 MG CAPS capsule TAKE 2 CAPSULES (0.8 MG TOTAL) BY MOUTH DAILY AFTER BREAKFAST.    Allergies: Allergies as of 03/13/2022 - Review Complete 03/13/2022  Allergen Reaction Noted   Atorvastatin Other (See Comments) 12/06/2016   Past Medical History:  Diagnosis Date   HTN (hypertension), benign    Hyperlipidemia    Type 2 diabetes, uncontrolled, with neuropathy     Family History:  Diabetes HTN  Social History:  Lives in an apartment by himself No trouble with finances He has Fish farm manager Amy (sister) and Solicitor (  nephew), live in [place]  Last drink was 3 weeks ago. He'll have 2 40oz on the first of the month. No smoking, no dip, no e-cigarettes He uses cocaine each month.   Review of Systems: A complete ROS was negative except as per HPI.   Physical Exam: Blood pressure 130/75, pulse 99, temperature (!) 94.3 F (34.6 C), resp. rate (!) 29, height 6' (1.829 m), weight 104.3 kg, SpO2 100 %.  GEN: drowsy HEENT: dry mucous membranes, whitish substance on hard palate, difficulty opening mouth fully CV: feet cool to touch, calves and head normal temperature. PULM: tachypnea, increased WOB, pausing after 3-4 sentences ABD: tender to palpation of epigastrum DERM: no rashes, bruises, or open wounds NEURO: alert & oriented X 4  EKG: personally reviewed my interpretation is normal sinus rhythm without signs of ischemia  CXR: personally reviewed my interpretation is midline  trachea, pulmonary markings extending to pleural lining with no gap, cardiac silhouette approximately 1/2 the width of the thoracic cavity.   Assessment & Plan by Problem: Principal Problem:   DKA (diabetic ketoacidosis) ([place])  [id number] Gap Metabolic Acidosis: Patient p/w nausea, blurry vision, and polyuria. He was found to be tachypneic, tachycardic, and hypothermic. His labs were notable for elevated ketoacids, lactic acid, elevated anion gap, bicarb of 9, pH of 7.099, and a corrected sodium of 153.  Patient presenting in DKA is likely multifactorial. He has a history of regular cocaine use, he reports at least monthly use. Cocaine blocks the reuptake of monoamine transporters like NE and epinephrine which increase serum glucose. In addition, patient has been without his antidiabetes medications for multiple months. Combine this with the fact that he might be in an insulinopenic state or deficient state and the result is his current clinical picture. Although patient has an elevated lactic acidosis it is likely not a type A lactic acidosis given patient presented with elevated blood pressures. His T2DM and DKA could have resulted in a type B lactic acidosis or d-lactic acidosis as well. Finally, although patient has elevated serum ketoacids which we are attributing to an insulin deficient state, his poor oral intake over the last several days could have result in the production of ketoacids as well, though this is likely only contributing minimally. Rule out other causes of acidosis Salicylate, ethanol UDS Correct catabolic state Endotool for insulin and CBG D5LR for CBG < 250 S/p 1 X 1L NS bolus, 2 X 1L LR boluses 1/2 NS @ 200 ml/hr until 06:00 on 5/28 Beta-hydroxybutyrate q8h Avoid harm Q4h BMPs Replete K to between 4-5 D50 for glucose hypoglycemia Switch form 1/2 NS to NS if corrected sodium faster than 12 points in 24 hours. Sepsis rule out: presented tachypnea and hypothermia, and  has been tachycardic. Leukocytosis as well. Has no respiratory or urinary symptoms.  Blood culture X2 pending, UA and Ucx pending Cefepime and Vancomycin AKI: Serum creatinine 2.73. Baseline appears to be 1.51 or lower. Likely prerenal in etiology. Should improve with rehydration.  Trend BMPs Esophagitis:  Restart fluconazole when tolerating PO, consider IV fluconazole if remains NPO for extended period.   Chronic problems HTN: holding home antihypertensives T2DM: holding metformin, Ozempic (needs outpatient refilled) HLD: holding statin BPH: holding flomax Cocaine use disorder- TOC   Dispo: Admit patient to Inpatient with expected length of stay greater than 2 midnights.  Signed: Vertell Novak, Medical Student 03/13/2022, 4:09 PM    Attestation for Student Documentation:  I personally was present and performed or re-performed the history,  physical exam and medical decision-making activities of this service and have verified that the service and findings are accurately documented in the student's note.  Rick Duff, MD 03/13/2022, 8:12 PM

## 2022-03-13 NOTE — ED Notes (Signed)
ED Provider at bedside. 

## 2022-03-14 DIAGNOSIS — Z794 Long term (current) use of insulin: Secondary | ICD-10-CM

## 2022-03-14 LAB — BASIC METABOLIC PANEL
Anion gap: 9 (ref 5–15)
Anion gap: 9 (ref 5–15)
Anion gap: 11 (ref 5–15)
Anion gap: 11 (ref 5–15)
Anion gap: 7 (ref 5–15)
BUN: 42 mg/dL — ABNORMAL HIGH (ref 8–23)
BUN: 38 mg/dL — ABNORMAL HIGH (ref 8–23)
BUN: 32 mg/dL — ABNORMAL HIGH (ref 8–23)
BUN: 25 mg/dL — ABNORMAL HIGH (ref 8–23)
BUN: 21 mg/dL (ref 8–23)
CO2: 17 mmol/L — ABNORMAL LOW (ref 22–32)
CO2: 20 mmol/L — ABNORMAL LOW (ref 22–32)
CO2: 19 mmol/L — ABNORMAL LOW (ref 22–32)
CO2: 13 mmol/L — ABNORMAL LOW (ref 22–32)
CO2: 19 mmol/L — ABNORMAL LOW (ref 22–32)
Calcium: 9.5 mg/dL (ref 8.9–10.3)
Calcium: 9.6 mg/dL (ref 8.9–10.3)
Calcium: 9.6 mg/dL (ref 8.9–10.3)
Calcium: 8.9 mg/dL (ref 8.9–10.3)
Calcium: 8.6 mg/dL — ABNORMAL LOW (ref 8.9–10.3)
Chloride: 123 mmol/L — ABNORMAL HIGH (ref 98–111)
Chloride: 122 mmol/L — ABNORMAL HIGH (ref 98–111)
Chloride: 121 mmol/L — ABNORMAL HIGH (ref 98–111)
Chloride: 121 mmol/L — ABNORMAL HIGH (ref 98–111)
Chloride: 115 mmol/L — ABNORMAL HIGH (ref 98–111)
Creatinine, Ser: 1.34 mg/dL — ABNORMAL HIGH (ref 0.61–1.24)
Creatinine, Ser: 1.23 mg/dL (ref 0.61–1.24)
Creatinine, Ser: 1.15 mg/dL (ref 0.61–1.24)
Creatinine, Ser: 1.18 mg/dL (ref 0.61–1.24)
Creatinine, Ser: 1.11 mg/dL (ref 0.61–1.24)
GFR, Estimated: 56 mL/min — ABNORMAL LOW (ref >60)
GFR, Estimated: >60 mL/min (ref >60)
GFR, Estimated: >60 mL/min (ref >60)
GFR, Estimated: >60 mL/min (ref >60)
GFR, Estimated: >60 mL/min (ref >60)
Glucose, Bld: 185 mg/dL — ABNORMAL HIGH (ref 70–99)
Glucose, Bld: 167 mg/dL — ABNORMAL HIGH (ref 70–99)
Glucose, Bld: 181 mg/dL — ABNORMAL HIGH (ref 70–99)
Glucose, Bld: 344 mg/dL — ABNORMAL HIGH (ref 70–99)
Glucose, Bld: 364 mg/dL — ABNORMAL HIGH (ref 70–99)
Potassium: 5.3 mmol/L — ABNORMAL HIGH (ref 3.5–5.1)
Potassium: 4 mmol/L (ref 3.5–5.1)
Potassium: 3.4 mmol/L — ABNORMAL LOW (ref 3.5–5.1)
Potassium: 4.9 mmol/L (ref 3.5–5.1)
Potassium: 3.7 mmol/L (ref 3.5–5.1)
Sodium: 149 mmol/L — ABNORMAL HIGH (ref 135–145)
Sodium: 151 mmol/L — ABNORMAL HIGH (ref 135–145)
Sodium: 151 mmol/L — ABNORMAL HIGH (ref 135–145)
Sodium: 145 mmol/L (ref 135–145)
Sodium: 141 mmol/L (ref 135–145)

## 2022-03-14 LAB — GLUCOSE, CAPILLARY
Glucose-Capillary: 122 mg/dL — ABNORMAL HIGH (ref 70–99)
Glucose-Capillary: 139 mg/dL — ABNORMAL HIGH (ref 70–99)
Glucose-Capillary: 148 mg/dL — ABNORMAL HIGH (ref 70–99)
Glucose-Capillary: 150 mg/dL — ABNORMAL HIGH (ref 70–99)
Glucose-Capillary: 152 mg/dL — ABNORMAL HIGH (ref 70–99)
Glucose-Capillary: 153 mg/dL — ABNORMAL HIGH (ref 70–99)
Glucose-Capillary: 155 mg/dL — ABNORMAL HIGH (ref 70–99)
Glucose-Capillary: 160 mg/dL — ABNORMAL HIGH (ref 70–99)
Glucose-Capillary: 163 mg/dL — ABNORMAL HIGH (ref 70–99)
Glucose-Capillary: 171 mg/dL — ABNORMAL HIGH (ref 70–99)
Glucose-Capillary: 195 mg/dL — ABNORMAL HIGH (ref 70–99)
Glucose-Capillary: 205 mg/dL — ABNORMAL HIGH (ref 70–99)
Glucose-Capillary: 264 mg/dL — ABNORMAL HIGH (ref 70–99)
Glucose-Capillary: 356 mg/dL — ABNORMAL HIGH (ref 70–99)

## 2022-03-14 LAB — RAPID URINE DRUG SCREEN, HOSP PERFORMED
Amphetamines: NOT DETECTED
Barbiturates: NOT DETECTED
Benzodiazepines: NOT DETECTED
Cocaine: NOT DETECTED
Opiates: NOT DETECTED
Tetrahydrocannabinol: NOT DETECTED

## 2022-03-14 LAB — HEMOGLOBIN A1C
Hgb A1c MFr Bld: 13 % — ABNORMAL HIGH (ref 4.8–5.6)
Mean Plasma Glucose: 326.4 mg/dL

## 2022-03-14 LAB — LACTIC ACID, PLASMA: Lactic Acid, Venous: 2.3 mmol/L (ref 0.5–1.9)

## 2022-03-14 LAB — BETA-HYDROXYBUTYRIC ACID: Beta-Hydroxybutyric Acid: 0.75 mmol/L — ABNORMAL HIGH (ref 0.05–0.27)

## 2022-03-14 MED ORDER — INSULIN ASPART 100 UNIT/ML IJ SOLN
0.0000 [IU] | INTRAMUSCULAR | Status: DC
  Administered 2022-03-14: 15 [IU] via SUBCUTANEOUS
  Administered 2022-03-15 (×2): 3 [IU] via SUBCUTANEOUS

## 2022-03-14 MED ORDER — SODIUM CHLORIDE 0.45 % IV SOLN: INTRAVENOUS | Status: AC

## 2022-03-14 MED ORDER — CHLORHEXIDINE GLUCONATE CLOTH 2 % EX PADS
6.0000 | MEDICATED_PAD | Freq: Every day | CUTANEOUS | Status: DC
  Administered 2022-03-15 – 2022-03-17 (×3): 6 via TOPICAL

## 2022-03-14 MED ORDER — INSULIN ASPART 100 UNIT/ML IJ SOLN
0.0000 [IU] | Freq: Three times a day (TID) | INTRAMUSCULAR | Status: DC
  Administered 2022-03-14: 8 [IU] via SUBCUTANEOUS

## 2022-03-14 MED ORDER — SODIUM CHLORIDE 0.9 % IV SOLN
2.0000 g | Freq: Three times a day (TID) | INTRAVENOUS | Status: DC
  Administered 2022-03-14 – 2022-03-15 (×4): 2 g via INTRAVENOUS
  Filled 2022-03-14 (×4): qty 12.5

## 2022-03-14 MED ORDER — INSULIN GLARGINE-YFGN 100 UNIT/ML ~~LOC~~ SOLN
20.0000 [IU] | Freq: Once | SUBCUTANEOUS | Status: AC
  Administered 2022-03-14: 20 [IU] via SUBCUTANEOUS
  Filled 2022-03-14: qty 0.2

## 2022-03-14 MED ORDER — SODIUM CHLORIDE 0.45 % IV SOLN: INTRAVENOUS | Status: DC

## 2022-03-14 MED ORDER — INSULIN ASPART 100 UNIT/ML IJ SOLN: 0.0000 [IU] | Freq: Three times a day (TID) | INTRAMUSCULAR | Status: DC

## 2022-03-14 MED ORDER — HYDROCHLOROTHIAZIDE 12.5 MG PO TABS
12.5000 mg | ORAL_TABLET | Freq: Every day | ORAL | Status: DC
  Administered 2022-03-14 – 2022-03-17 (×4): 12.5 mg via ORAL
  Filled 2022-03-14 (×6): qty 1

## 2022-03-14 MED ORDER — FLUCONAZOLE 100 MG PO TABS
200.0000 mg | ORAL_TABLET | Freq: Every day | ORAL | Status: DC
  Administered 2022-03-14 – 2022-03-16 (×3): 200 mg via ORAL
  Filled 2022-03-14 (×3): qty 2

## 2022-03-14 MED ORDER — PANTOPRAZOLE SODIUM 40 MG IV SOLR
40.0000 mg | INTRAVENOUS | Status: DC
  Administered 2022-03-14 – 2022-03-16 (×3): 40 mg via INTRAVENOUS
  Filled 2022-03-14 (×3): qty 10

## 2022-03-14 MED ORDER — CHLORHEXIDINE GLUCONATE CLOTH 2 % EX PADS: 6.0000 | MEDICATED_PAD | Freq: Every day | CUTANEOUS | Status: DC

## 2022-03-14 MED ORDER — POTASSIUM CHLORIDE 20 MEQ PO PACK
40.0000 meq | PACK | Freq: Two times a day (BID) | ORAL | Status: AC
  Administered 2022-03-14 (×2): 40 meq via ORAL
  Filled 2022-03-14 (×2): qty 2

## 2022-03-14 MED ORDER — POTASSIUM CHLORIDE CRYS ER 20 MEQ PO TBCR: 40.0000 meq | EXTENDED_RELEASE_TABLET | Freq: Two times a day (BID) | ORAL | Status: DC

## 2022-03-14 MED ORDER — METRONIDAZOLE 500 MG/100ML IV SOLN: 500.0000 mg | Freq: Two times a day (BID) | INTRAVENOUS | Status: DC

## 2022-03-14 MED ORDER — INSULIN GLARGINE-YFGN 100 UNIT/ML ~~LOC~~ SOLN
15.0000 [IU] | Freq: Once | SUBCUTANEOUS | Status: DC
  Filled 2022-03-14: qty 0.15

## 2022-03-14 MED ORDER — INSULIN STARTER KIT- PEN NEEDLES (ENGLISH)
1.0000 | Freq: Once | Status: AC
  Administered 2022-03-14: 1
  Filled 2022-03-14: qty 1

## 2022-03-14 MED ORDER — LISINOPRIL 20 MG PO TABS
20.0000 mg | ORAL_TABLET | Freq: Every day | ORAL | Status: DC
  Administered 2022-03-14 – 2022-03-17 (×4): 20 mg via ORAL
  Filled 2022-03-14 (×4): qty 1

## 2022-03-14 MED ORDER — INSULIN GLARGINE-YFGN 100 UNIT/ML ~~LOC~~ SOLN
20.0000 [IU] | Freq: Every day | SUBCUTANEOUS | Status: DC
  Filled 2022-03-14: qty 0.2

## 2022-03-14 MED ORDER — VANCOMYCIN HCL 1250 MG/250ML IV SOLN
1250.0000 mg | INTRAVENOUS | Status: DC
  Administered 2022-03-14: 1250 mg via INTRAVENOUS
  Filled 2022-03-14: qty 250

## 2022-03-14 MED ORDER — INSULIN ASPART 100 UNIT/ML IJ SOLN: 0.0000 [IU] | Freq: Every day | INTRAMUSCULAR | Status: DC

## 2022-03-14 MED ORDER — INSULIN GLARGINE-YFGN 100 UNIT/ML ~~LOC~~ SOLN
15.0000 [IU] | Freq: Every day | SUBCUTANEOUS | Status: DC
  Filled 2022-03-14: qty 0.15

## 2022-03-14 NOTE — Progress Notes (Signed)
Pharmacy Antibiotic Note  Craig Young is a 71 y.o. male admitted on 03/13/2022 presenting with generalized weakness, recent throat infection, DKA and, concern for sepsis.  Pharmacy has been consulted for vancomycin and cefepime dosing. DKA has markedly improved with CBG, BHB, and anion gap trending down. Renal function has markedly improved with Scr of 1.23 so antibiotics will be adjusted accordingly.  Plan: Vancomycin 1250 mg every 24 hours for [facility] of 451 using above serum creatinine and a weight adjusted VD of 0.5  Cefepime 2g IV q 8 hours Flagyl per MD With significant improvement, will follow plans for antibiotic deescalation. Vancomycin levels as needed  Height: 6' (182.9 cm) Weight: 104.3 kg (230 lb) IBW/kg (Calculated) : 77.6  Temp (24hrs), Avg:95.7 F (35.4 C), Min:93.8 F (34.3 C), Max:98 F (36.7 C)  Recent Labs  Lab 03/09/22 1021 03/13/22 1238 03/13/22 1321 03/13/22 1438 03/13/22 1919 03/13/22 2158 03/14/22 0159 03/14/22 0452  WBC 13.5* 18.4*  --   --  26.3*  --   --   --   CREATININE 1.51* 1.24 2.73*  --  2.03* 1.92* 1.34* 1.23  LATICACIDVEN  --  7.0*  --  5.9*  --   --   --   --      Estimated Creatinine Clearance: 66.8 mL/min (by C-G formula based on SCr of 1.23 mg/dL).    Allergies  Allergen Reactions   Atorvastatin Other (See Comments)    Myalgias/cramps   Bcx 5/27L IP   Thank you for allowing pharmacy to participate in this patient's care.  Enos Fling, PharmD PGY1 Pharmacy Resident 03/14/2022 7:59 AM Check [email or web address] for unit specific pharmacy number

## 2022-03-14 NOTE — Evaluation (Signed)
Physical Therapy Evaluation Patient Details Name: Craig Young MRN: [id number] DOB: [birth year] Today's Date: 03/14/2022  History of Present Illness  The pt is a 71 yo male presenting 5/27 with generalized weakness. Pt found to have AKI and DKA. PMH includes: poorly controlled DM II (3 months without insulin use reccently), HTN, HLD, BPH, and cocaine use.   Clinical Impression  Pt in bed upon arrival of PT, agreeable to evaluation at this time. Prior to admission the pt was completely independent, living alone in an apt with elevator entry, no use of DME, still driving and independent in IADLs, walking 45 min each day for exercise. The pt now presents with limitations in functional mobility, endurance, strength, and stability due to above dx, and will continue to benefit from skilled PT to address these deficits. The pt was able to complete bed mobility and sit-stand transfers without assist, but frequently leans over in sitting or is dependent on UE support to brace due to fatigue. The pt was able to complete ~175 ft hallway ambulation, but asked for IV pole for UE support and uses slowed gait speed with mild instability. Will benefit from continued skilled PT acutely and after d/c to facilitate return to prior level of mobility and independence.   Gait Speed: 0.16m/s using IV pole (Gait speed <0.72m/s indicates increased risk of falls and dependence in ADLs)   Recommendations for follow up therapy are one component of a multi-disciplinary discharge planning process, led by the attending physician.  Recommendations may be updated based on patient status, additional functional criteria and insurance authorization.  Follow Up Recommendations Home health PT    Assistance Recommended at Discharge Intermittent Supervision/Assistance  Patient can return home with the following  A little help with walking and/or transfers;Assistance with cooking/housework;A little help with  bathing/dressing/bathroom;Help with stairs or ramp for entrance    Equipment Recommendations Other (comment) (shower seat, trial cane vs no DME)  Recommendations for Other Services       Functional Status Assessment Patient has had a recent decline in their functional status and demonstrates the ability to make significant improvements in function in a reasonable and predictable amount of time.     Precautions / Restrictions Precautions Precautions: Fall Restrictions Weight Bearing Restrictions: No      Mobility  Bed Mobility Overal bed mobility: Modified Independent             General bed mobility comments: increased time and line management    Transfers Overall transfer level: Needs assistance Equipment used: None Transfers: Sit to/from Stand Sit to Stand: Min guard           General transfer comment: minG for safety, no overt LOB or instability    Ambulation/Gait Ambulation/Gait assistance: Min guard Gait Distance (Feet): 175 Feet Assistive device: IV Pole Gait Pattern/deviations: Step-through pattern, Decreased stride length, Wide base of support Gait velocity: 0.45 m/s Gait velocity interpretation: 1.31 - 2.62 ft/sec, indicative of limited community ambulator Pre-gait activities: standing marches at EOB no UE support General Gait Details: pt with mild drifting and slowed gait. mild instability and pt asking to hold IV pole for balance.    Balance Overall balance assessment: Mild deficits observed, not formally tested                                           Pertinent Vitals/Pain Pain Assessment Pain Assessment: No/denies  pain    Home Living Family/patient expects to be discharged to:: Private residence Living Arrangements: Alone Available Help at Discharge: Family;Available PRN/intermittently Type of Home: Apartment Home Access: Elevator       Home Layout: One level Home Equipment: None Additional Comments: pt walks 45  min a day each morning    Prior Function Prior Level of Function : Independent/Modified Independent;Driving             Mobility Comments: independent without DME, no falls ADLs Comments: independent     Hand Dominance   Dominant Hand: Right    Extremity/Trunk Assessment   Upper Extremity Assessment Upper Extremity Assessment: Overall WFL for tasks assessed    Lower Extremity Assessment Lower Extremity Assessment: Overall WFL for tasks assessed (grossly functional to MMT, poor endurance. pt leaning trunk to "brace" with testing of LLE but unable to state why he felt the need to do this with LLE only. reports full sensation)    Cervical / Trunk Assessment Cervical / Trunk Assessment: Other exceptions Cervical / Trunk Exceptions: poor functional core stregnth, pt fatiguing in sititng quickly and either leaning to side or bracing with UE after 5-10 secconds sitting  Communication   Communication: No difficulties  Cognition Arousal/Alertness: Awake/alert Behavior During Therapy: WFL for tasks assessed/performed Overall Cognitive Status: Within Functional Limits for tasks assessed                                 General Comments: pt with slightly flat affect, but pleasant and agreeable        General Comments General comments (skin integrity, edema, etc.): pt fatigues quickly, states he feels his heart racing, HR 122bpm        Assessment/Plan    PT Assessment Patient needs continued PT services  PT Problem List Cardiopulmonary status limiting activity;Decreased activity tolerance;Decreased balance       PT Treatment Interventions Gait training;Functional mobility training;Stair training;DME instruction;Therapeutic activities;Therapeutic exercise;Balance training;Patient/family education    PT Goals (Current goals can be found in the Care Plan section)  Acute Rehab PT Goals Patient Stated Goal: regain energy PT Goal Formulation: With patient Time  For Goal Achievement: 03/28/22 Potential to Achieve Goals: Good    Frequency Min 3X/week        AM-PAC PT "6 Clicks" Mobility  Outcome Measure Help needed turning from your back to your side while in a flat bed without using bedrails?: None Help needed moving from lying on your back to sitting on the side of a flat bed without using bedrails?: None Help needed moving to and from a bed to a chair (including a wheelchair)?: A Little Help needed standing up from a chair using your arms (e.g., wheelchair or bedside chair)?: A Little Help needed to walk in hospital room?: A Little Help needed climbing 3-5 steps with a railing? : A Little 6 Click Score: 20    End of Session Equipment Utilized During Treatment: Gait belt Activity Tolerance: Patient tolerated treatment well Patient left: in bed;with call bell/phone within reach Nurse Communication: Mobility status PT Visit Diagnosis: Other abnormalities of gait and mobility (R26.89);Muscle weakness (generalized) (M62.81)    Time: [id number] PT Time Calculation (min) (ACUTE ONLY): 26 min   Charges:   PT Evaluation $PT Eval Low Complexity: 1 Low PT Treatments $Therapeutic Exercise: 8-22 mins        West Carbo, PT, DPT   Acute Rehabilitation Department Pager #: [phone] -  [phone]  Sandra Cockayne 03/14/2022, 8:56 AM

## 2022-03-14 NOTE — Plan of Care (Signed)
  Problem: Clinical Measurements: Goal: Diagnostic test results will improve Outcome: Progressing   Problem: Health Behavior/Discharge Planning: Goal: Ability to manage health-related needs will improve Outcome: Not Progressing   Problem: Activity: Goal: Risk for activity intolerance will decrease Outcome: Not Progressing   Problem: Nutrition: Goal: Adequate nutrition will be maintained Outcome: Not Progressing

## 2022-03-14 NOTE — Progress Notes (Signed)
Internal Medicine Attending:   I saw and examined the patient. I reviewed the resident's H&P note and I agree with the resident's findings and plan as documented in the resident's note.  In brief, patient is a 71 year old male with past medical history of hypertension, hyperlipidemia, uncontrolled type 2 diabetes who presented to the ED with decreased oral intake, nausea and abdominal pain over the last 4 to 5 days.  Patient states that he ran out of his Ozempic 3 months ago and that over the last couple weeks has noticed his eyesight has become blurry which indicates high blood sugar to her.  Patient has also had intermittent episodes of nausea and vomiting and decreased urinary output.  Patient also complained of some associated abdominal pain.  In the ED, patient was noted to be hypothermic, tachypneic and tachycardic and had DKA on blood work as well as an associated AKI and lactic acidosis.  Today, patient states that he feels much better and has an appetite.  On exam, patient is oriented x3 and appears well.  Lungs are clear to auscultation bilaterally and cardiovascular exam reveals regular rate and rhythm with normal heart sounds.  Abdomen is soft, nontender, nondistended with normoactive bowel sounds.  No lower extremity edema noted and patient's mood and affect are within normal limits.  Patient was admitted to the hospital with DKA secondary to uncontrolled type 2 diabetes as well as an anion gap metabolic acidosis secondary to lactic acidosis as well as his DKA.  Patient was started on insulin drip and his anion gap closed x2.  Patient's been hydroxybutyric acid level dropped to 0.75.  Patient was transitioned off insulin drip and started on long-acting insulin with initiation of diet today.  We will continue to monitor the patient closely.  His bicarbonate level still remains slightly low at 19.  Patient's lactic acidosis is improved to 2.3 from greater than 7.  The etiology behind his lactic  acid remains uncertain at this time and may be secondary to an underlying sepsis given that he had leukocytosis and was hypothermic and tachycardic on admission.  We will continue with broad-spectrum antibiotics for now with cefepime and vancomycin.  We will follow-up blood cultures.  If no infectious etiology is found would consider stopping his antibiotics tomorrow.  Of note, patient also had AKI with a creatinine of 2.73 on admission which improved to 1.2 after fluids.  Patient's AKI was likely multifactorial secondary to decreased oral intake as well as continuing his lisinopril and HCTZ at home.  His antihypertensives are on hold for now but will consider initiating them again in a.m. if his blood pressure remains elevated and his creatinine has normalized.

## 2022-03-14 NOTE — Plan of Care (Signed)
  Problem: Clinical Measurements: Goal: Will remain free from infection Outcome: Progressing Goal: Respiratory complications will improve Outcome: Progressing Goal: Cardiovascular complication will be avoided Outcome: Progressing   

## 2022-03-14 NOTE — Progress Notes (Signed)
Inpatient Diabetes Program Recommendations  AACE/ADA: New Consensus Statement on Inpatient Glycemic Control (2015)  Target Ranges:  Prepandial:   less than 140 mg/dL      Peak postprandial:   less than 180 mg/dL (1-2 hours)      Critically ill patients:  140 - 180 mg/dL    Latest Reference Range & Units 03/13/22 13:21  Sodium 135 - 145 mmol/L 142  Potassium 3.5 - 5.1 mmol/L 5.0  Chloride 98 - 111 mmol/L 98  CO2 22 - 32 mmol/L 8 (L)  Glucose 70 - 99 mg/dL 062 (HH)  BUN 8 - 23 mg/dL 45 (H)  Creatinine 6.94 - 1.24 mg/dL 8.54 (H)  Calcium 8.9 - 10.3 mg/dL 62.7 (H)  Anion gap 5 - 15  36 (H)    Latest Reference Range & Units 03/13/22 13:11 03/13/22 15:18 03/13/22 15:56 03/13/22 16:30 03/13/22 17:02 03/13/22 17:35 03/13/22 18:05 03/13/22 19:00 03/13/22 19:53  Glucose-Capillary 70 - 99 mg/dL >035 (HH) >009 (HH)  IV Insulin Drip Started @1530  >600 (HH) >600 (HH) 570 (HH) 526 (HH) 505 (HH) 466 (H) 454 (H)    Latest Reference Range & Units 03/13/22 21:02 03/13/22 22:08 03/13/22 23:02 03/14/22 00:02 03/14/22 01:00 03/14/22 02:08 03/14/22 03:01  Glucose-Capillary 70 - 99 mg/dL 03/16/22 (H)  IV Insulin Drip Infusing 297 (H) 237 (H) 153 (H) 148 (H) 163 (H) 195 (H)    Latest Reference Range & Units 03/14/22 03:59 03/14/22 04:59 03/14/22 05:57 03/14/22 07:12 03/14/22 08:51 03/14/22 10:10 03/14/22 13:00  Glucose-Capillary 70 - 99 mg/dL 03/16/22 (H)  IV Insulin Drip Infusing 155 (H) 150 (H) 152 (H)  20 units Semglee @0714  205 (H) 160 (H) 122 (H)  (H): Data is abnormally high  Latest Reference Range & Units 03/13/22 21:43  Hemoglobin A1C 4.8 - 5.6 % 13.0 (H)  (326 mg/dl)  (H): Data is abnormally high  Admit with: T2DM/DKA/Anion Gap Metabolic Acidosis  History: DM2, Cocaine Abuse  Home DM Meds: Metformin 1000 mg BID       Ozempic 1 mg Qweek (None for 3 months)  Current Orders: IV Insulin Drip    Note Current A1c is 13%--Pt admits to being out of Ozempic for 3 months Admit with  DKA Transitioned to SQ Insulin this AM  [facility] Last seen April 2019   MD- Note that plan may be to discharge pt on Insulin.  Diabetes Coordinator RN to see pt Monday 05/29 to discuss going home on insulin.  Will ask bedside Rn to begin allowing pt to inject all insulin for practice.  Looks like Lantus/Levemir are covered for basal insulin (pt has Medicare) Humalog covered for rapid-acting  Please consider starting Novolog Sensitive Correction Scale/ SSI (0-9 units) TID AC + HS     --Will follow patient during hospitalization--  Sunday RN, MSN, CDE Diabetes Coordinator Inpatient Glycemic Control Team Team Pager: [phone] (8a-5p)

## 2022-03-14 NOTE — Progress Notes (Addendum)
Subjective:  Overnight Events: No acute events or concerns overnight.  Patient seen and assessed on rounds this morning. The patient states that he feels much better today and has been able to eat some applesauce with no nausea or vomiting. He is requesting that his urinary catheter be removed.   Objective:  Vital signs in last 24 hours: Vitals:   03/13/22 2334 03/14/22 0503 03/14/22 0506 03/14/22 0807  BP: (!) 142/80 (!) 162/75 (!) 158/78 (!) 160/87  Pulse: (!) 107  94 100  Resp: (!) 26 (!) 25 (!) 23 20  Temp: 97.7 F (36.5 C) 97.8 F (36.6 C)  98.8 F (37.1 C)  TempSrc: Oral Axillary  Axillary  SpO2: 100%  100% 98%  Weight:      Height:       Weight change:   Intake/Output Summary (Last 24 hours) at 03/14/2022 1000 Last data filed at 03/14/2022 [id number] Gross per 24 hour  Intake 3401.57 ml  Output 3350 ml  Net 51.57 ml    Physical Exam   Constitutional: well appearing and sitting in bed, in no acute distress HENT: normocephalic atraumatic, mucous membranes moist Eyes: pupils equal and round, conjunctiva non-erythematous Cardiovascular: regular rate with normal rhythm, no m/r/g Pulmonary/Chest: normal work of breathing on room air, lungs clear to auscultation bilaterally Abdominal: soft, non-tender, non-distended, bowel sounds present MSK: normal bulk and tone, 5/5 strength in bilateral upper and lower extremities.  Skin: warm and dry. Neurological: alert and answering questions appropriately. Psych: appropriate mood and affect   Assessment/Plan:  Principal Problem:   DKA (diabetic ketoacidosis) ([place])   Craig Young is a 71 y.o. male with past medical history of hypertension, hyperlipidemia, type 2 diabetes mellitus who presented to the ED for blurry vision, nausea, vomiting, confusion after being out of his Ozempic for 3 months and was found to be in DKA on admission.  #Type 2 Diabetes Mellitus #Diabetic ketoacidosis #Anion Gap Metabolic  Acidosis #Hypernatremia This morning the patient's anion gap has come down to 9, though his beta hydroxybutyrate remains elevated at 0.75. Transition to insulin Semglee was started, and we will plan to start trying PO intake this morning. The patient's A1C is elevated at 13.0, so the patient will likely need to go home on insulin. UDS was pan negative and Salicylate level was unremarkable. The patient's sodium this morning remained elevated at 151.  -Transition off of Endotool to Semglee -Recheck BMP at noon today -Trend Beta-hydroxybutyrate -0.45% saline for hypernatremia -Once tolerating PO, encourage water intake -Monitor CBG -Replete K as needed -PT/OT eval (PT recommends HHPT)  #Lactic Acidosis #Concern for sepsis His T2DM and DKA could have resulted in a type B lactic acidosis or d-lactic acidosis as well. His poor oral intake over the last several days could have result in the production of ketoacids as well, though this is likely only contributing minimally. Could also be secondary to patient's hypothermia when he presented to the ED. UA and blood cultures were checked to rule out sepsis as a cause of his lactic acidosis. UA showed glucose but did not show evidence of infection. Blood cultures pending. Patient was empirically placed on Cefepime and Vancomycin -Trend lactic acid to ensure lactic acid downtrending -Continue Cefepime and Vancomycin pending blood cultures -Follow-up blood cultures (may not be optimal due to inadequate volume of blood received)  #Acute Kidney Injury The patient's initial serum creatinine was 2.73, now improved to 1.23 after fluids. Patient's AKI likely prerenal and secondary to poor PO intake  over the past few days. Plan to continue IV hydration and encourage PO fluid intake as he can tolerate. -Recheck BMP -Encourage PO fluid intake  #Esophagitis Patient presented to the ED complaining of pain with swallowing. He presented to the ED on 5/23 with a  similar complaint and was discharged home on fluconazole. Given that he is still reporting this pain, plan to continue fluconazole once he is able to tolerate PO. -Fluconazole once he can tolerate PO  #Hypertension Patient with longstanding hypertension on HCTZ 12.5 mg daily and Lisinopril 20 mg daily. Initial plan was to hold home meds in the setting of severe illness. Latest BP 160/87 -Holding antihypertensives  #Hyperlipidemia Patient with longstanding hyperlipidemia on Crestor 20 mg daily. Initial plan was to hold home meds in the setting of severe illness. -Holding Crestor  #Cocaine Use Disorder Patient endorses cocaine use approximately once per month. Plan to consult TOC regarding cessation of cocaine use. -TOC consult  #Benign Prostatic Hypertrophy Patient with longstanding BPH on Flomax 0.4 mg daily. Initial plan was to hold home meds in the setting of severe illness. -Holding Flomax -Remove catheter today per patient's request   Diet: Full Liquids VTE: SCDs IVF: 0.45% saline for hypernatremia Code: Full   Prior to Admission Living Arrangement: Home Anticipated Discharge Location: TBD Barriers to Discharge: Medical Stability Dispo: Anticipated discharge in approximately 2-3 day(s).    LOS: 1 day   Elane Fritz, Medical Student 03/14/2022, 10:00 AM   Attestation for Student Documentation:  I personally was present and performed or re-performed the history, physical exam and medical decision-making activities of this service and have verified that the service and findings are accurately documented in the student's note.  France Ravens, MD 03/14/2022, 11:41 AM

## 2022-03-15 LAB — COMPREHENSIVE METABOLIC PANEL WITH GFR
ALT: 13 U/L (ref 0–44)
AST: 19 U/L (ref 15–41)
Albumin: 2.8 g/dL — ABNORMAL LOW (ref 3.5–5.0)
Alkaline Phosphatase: 56 U/L (ref 38–126)
Anion gap: 7 (ref 5–15)
BUN: 18 mg/dL (ref 8–23)
CO2: 19 mmol/L — ABNORMAL LOW (ref 22–32)
Calcium: 8.6 mg/dL — ABNORMAL LOW (ref 8.9–10.3)
Chloride: 117 mmol/L — ABNORMAL HIGH (ref 98–111)
Creatinine, Ser: 1.01 mg/dL (ref 0.61–1.24)
GFR, Estimated: >60 mL/min
Glucose, Bld: 164 mg/dL — ABNORMAL HIGH (ref 70–99)
Potassium: 3.7 mmol/L (ref 3.5–5.1)
Sodium: 143 mmol/L (ref 135–145)
Total Bilirubin: 0.6 mg/dL (ref 0.3–1.2)
Total Protein: 5 g/dL — ABNORMAL LOW (ref 6.5–8.1)

## 2022-03-15 LAB — GLUCOSE, CAPILLARY
Glucose-Capillary: 148 mg/dL — ABNORMAL HIGH (ref 70–99)
Glucose-Capillary: 165 mg/dL — ABNORMAL HIGH (ref 70–99)
Glucose-Capillary: 204 mg/dL — ABNORMAL HIGH (ref 70–99)
Glucose-Capillary: 205 mg/dL — ABNORMAL HIGH (ref 70–99)
Glucose-Capillary: 237 mg/dL — ABNORMAL HIGH (ref 70–99)
Glucose-Capillary: 247 mg/dL — ABNORMAL HIGH (ref 70–99)

## 2022-03-15 LAB — CBC
HCT: 39.6 % (ref 39.0–52.0)
Hemoglobin: 13.5 g/dL (ref 13.0–17.0)
MCH: 29.3 pg (ref 26.0–34.0)
MCHC: 34.1 g/dL (ref 30.0–36.0)
MCV: 85.9 fL (ref 80.0–100.0)
Platelets: 199 10*3/uL (ref 150–400)
RBC: 4.61 MIL/uL (ref 4.22–5.81)
RDW: 12.7 % (ref 11.5–15.5)
WBC: 15.7 10*3/uL — ABNORMAL HIGH (ref 4.0–10.5)
nRBC: 0 % (ref 0.0–0.2)

## 2022-03-15 LAB — CBC WITH DIFFERENTIAL/PLATELET
Abs Immature Granulocytes: 0.13 10*3/uL — ABNORMAL HIGH (ref 0.00–0.07)
Basophils Absolute: 0 10*3/uL (ref 0.0–0.1)
Basophils Relative: 0 %
Eosinophils Absolute: 0 10*3/uL (ref 0.0–0.5)
Eosinophils Relative: 0 %
HCT: 39 % (ref 39.0–52.0)
Hemoglobin: 13.8 g/dL (ref 13.0–17.0)
Immature Granulocytes: 1 %
Lymphocytes Relative: 13 %
Lymphs Abs: 2.1 10*3/uL (ref 0.7–4.0)
MCH: 30.4 pg (ref 26.0–34.0)
MCHC: 35.4 g/dL (ref 30.0–36.0)
MCV: 85.9 fL (ref 80.0–100.0)
Monocytes Absolute: 1.3 10*3/uL — ABNORMAL HIGH (ref 0.1–1.0)
Monocytes Relative: 8 %
Neutro Abs: 12.2 10*3/uL — ABNORMAL HIGH (ref 1.7–7.7)
Neutrophils Relative %: 78 %
Platelets: 198 10*3/uL (ref 150–400)
RBC: 4.54 MIL/uL (ref 4.22–5.81)
RDW: 12.9 % (ref 11.5–15.5)
WBC: 15.8 10*3/uL — ABNORMAL HIGH (ref 4.0–10.5)
nRBC: 0 % (ref 0.0–0.2)

## 2022-03-15 MED ORDER — TAMSULOSIN HCL 0.4 MG PO CAPS
0.8000 mg | ORAL_CAPSULE | Freq: Every day | ORAL | Status: DC
  Administered 2022-03-15 – 2022-03-17 (×3): 0.8 mg via ORAL
  Filled 2022-03-15 (×3): qty 2

## 2022-03-15 MED ORDER — LIDOCAINE VISCOUS HCL 2 % MT SOLN
15.0000 mL | Freq: Once | OROMUCOSAL | Status: AC
  Administered 2022-03-15: 15 mL via ORAL
  Filled 2022-03-15: qty 15

## 2022-03-15 MED ORDER — POTASSIUM CHLORIDE 20 MEQ PO PACK
40.0000 meq | PACK | Freq: Once | ORAL | Status: AC
  Administered 2022-03-15: 40 meq via ORAL
  Filled 2022-03-15: qty 2

## 2022-03-15 MED ORDER — INSULIN ASPART 100 UNIT/ML IJ SOLN
0.0000 [IU] | INTRAMUSCULAR | Status: DC
  Administered 2022-03-15 (×4): 3 [IU] via SUBCUTANEOUS
  Administered 2022-03-15: 1 [IU] via SUBCUTANEOUS
  Administered 2022-03-16: 3 [IU] via SUBCUTANEOUS
  Administered 2022-03-16 (×2): 2 [IU] via SUBCUTANEOUS
  Administered 2022-03-16: 7 [IU] via SUBCUTANEOUS
  Administered 2022-03-16 – 2022-03-17 (×2): 3 [IU] via SUBCUTANEOUS
  Administered 2022-03-17: 1 [IU] via SUBCUTANEOUS
  Administered 2022-03-17: 2 [IU] via SUBCUTANEOUS

## 2022-03-15 MED ORDER — INSULIN ASPART 100 UNIT/ML IJ SOLN: 0.0000 [IU] | Freq: Three times a day (TID) | INTRAMUSCULAR | Status: DC

## 2022-03-15 MED ORDER — ROSUVASTATIN CALCIUM 20 MG PO TABS
20.0000 mg | ORAL_TABLET | Freq: Every day | ORAL | Status: DC
  Administered 2022-03-15 – 2022-03-17 (×3): 20 mg via ORAL
  Filled 2022-03-15 (×3): qty 1

## 2022-03-15 MED ORDER — ALUM & MAG HYDROXIDE-SIMETH 200-200-20 MG/5ML PO SUSP
30.0000 mL | Freq: Once | ORAL | Status: AC
  Administered 2022-03-15: 30 mL via ORAL
  Filled 2022-03-15: qty 30

## 2022-03-15 MED ORDER — INSULIN GLARGINE-YFGN 100 UNIT/ML ~~LOC~~ SOLN
30.0000 [IU] | Freq: Every day | SUBCUTANEOUS | Status: DC
Start: 2022-03-15 — End: 2022-03-17
  Administered 2022-03-15 – 2022-03-17 (×3): 30 [IU] via SUBCUTANEOUS
  Filled 2022-03-15 (×3): qty 0.3

## 2022-03-15 MED ORDER — INSULIN GLARGINE-YFGN 100 UNIT/ML ~~LOC~~ SOLN
20.0000 [IU] | Freq: Every day | SUBCUTANEOUS | Status: DC
Start: 2022-03-15 — End: 2022-03-15
  Filled 2022-03-15: qty 0.2

## 2022-03-15 MED ORDER — INSULIN GLARGINE-YFGN 100 UNIT/ML ~~LOC~~ SOLN: 30.0000 [IU] | Freq: Every day | SUBCUTANEOUS | Status: DC

## 2022-03-15 NOTE — Progress Notes (Signed)
Inpatient Diabetes Program Recommendations  AACE/ADA: New Consensus Statement on Inpatient Glycemic Control (2015)  Target Ranges:  Prepandial:   less than 140 mg/dL      Peak postprandial:   less than 180 mg/dL (1-2 hours)      Critically ill patients:  140 - 180 mg/dL   Lab Results  Component Value Date   GLUCAP 247 (H) 03/15/2022   HGBA1C 13.0 (H) 03/13/2022    Latest Reference Range & Units 03/14/22 08:51 03/14/22 10:10 03/14/22 13:00 03/14/22 17:05 03/14/22 19:49 03/14/22 23:57 03/15/22 03:32 03/15/22 07:41 03/15/22 11:01  Glucose-Capillary 70 - 99 mg/dL 354 (H) 656 (H) 812 (H) 264 (H) 356 (H) 171 (H) 165 (H) 204 (H) 247 (H)    Review of Glycemic Control  Diabetes history: DM 2 Outpatient Diabetes medications: Ozempic in the past  Current orders for Inpatient glycemic control:  Semglee 30 units Daily Novolog 0-9 units Q4 hours  A1c 13% on 5/27  Inpatient Diabetes Program Recommendations:    Spoke with pt at bedside regarding A1c and glucose control at home. Pt reports while being able to get his medications his A1c was around a 6%. However, the last 3 months pt was not able to get his Ozempic from his doctors office and he never followed up with his PCP office. Pt reports he usually walks 45 minutes in the morning and then comes inside and eats a piece of fruit.  Discussed A1c level of 13%. Discussed glucose and A1c goals. Introduced the possible need for insulin going home.   Showed pt how to operate and administer insulin via the insulin pen. The operation of the insulin pen is very similar to his Ozempic medication at home.   Thanks,  Christena Deem RN, MSN, BC-ADM Inpatient Diabetes Coordinator Team Pager [phone] (8a-5p)

## 2022-03-15 NOTE — Progress Notes (Signed)
Mobility Specialist Progress Note:   03/15/22 1235  Mobility  Activity Ambulated with assistance in hallway  Level of Assistance Contact guard assist, steadying assist  Assistive Device None  Distance Ambulated (ft) 450 ft  Activity Response Tolerated well  $Mobility charge 1 Mobility   Pt received in bed willing to participate in mobility. No complaints of pain. Left in bed with call bell in reach and all needs met.   [facility] Jadah Bobak Mobility Specialist

## 2022-03-15 NOTE — Progress Notes (Signed)
SLP Cancellation Note  Patient Details Name: BRECKIN SAVANNAH MRN: [id number] DOB: Aug 20, 1951   Cancelled treatment:       Reason Eval/Treat Not Completed: Other (comment). Pt has an esophagram ordered, which will require NPO status. Pt also with SLP orders. Will await result of esophagram prior to SLP eval.    Jamarkus [place], Riley Nearing 03/15/2022, 10:01 AM

## 2022-03-15 NOTE — Progress Notes (Signed)
HD#2 Subjective:  Overnight Events: NAEON, CBG 364 at 20:00 but otherwise in mid 100s. Has had minimal PO intake    Patient seen and assessed at bedside. Reports still having difficulty with p.o. intake due to his dysphagia.  Patient reports that his dysphagia has been going on for approximately 1 month including solids and liquids.  Patient reports that he has not been on insulin before but understands that he needs to learn how to use insulin. Discussed having SLP assess and possible GI consult. Encouraged PO intake as tolerated.  Objective:  Vital signs in last 24 hours: Vitals:   03/14/22 1933 03/14/22 2030 03/14/22 2359 03/15/22 0332  BP: (!) 143/67  126/67 129/65  Pulse: 83 86 90 78  Resp: 20  (!) 22 (!) 21  Temp: 98.1 F (36.7 C)  98.2 F (36.8 C) 98 F (36.7 C)  TempSrc: Oral  Oral Oral  SpO2: 98%  100% 99%  Weight:      Height:       Supplemental O2: Room Air SpO2: 99 %   Physical Exam:  Constitutional: well appearing and sitting in bed, in no acute distress HENT: normocephalic atraumatic, mucous membranes moist Eyes: pupils equal and round, conjunctiva non-erythematous Cardiovascular: regular rate with normal rhythm, no m/r/g Pulmonary/Chest: normal work of breathing on room air, lungs clear to auscultation bilaterally Abdominal: soft, non-tender, non-distended, bowel sounds present MSK: normal bulk and tone, 5/5 strength in bilateral upper and lower extremities.  Skin: warm and dry. Neurological: alert and answering questions appropriately. Psych: appropriate mood and affect   Filed Weights   03/13/22 1150  Weight: 104.3 kg     Intake/Output Summary (Last 24 hours) at 03/15/2022 0658 Last data filed at 03/15/2022 0518 Gross per 24 hour  Intake 2072.68 ml  Output 2050 ml  Net 22.68 ml   Net IO Since Admission: 324.25 mL [03/15/22 0658]  Pertinent Labs:    Latest Ref Rng & Units 03/15/2022    1:12 AM 03/13/2022    7:19 PM 03/13/2022    2:06 PM  CBC   WBC 4.0 - 10.5 K/uL 15.7   26.3     Hemoglobin 13.0 - 17.0 g/dL 99.3   57.0   17.7    Hematocrit 39.0 - 52.0 % 39.6   46.2   56.0    Platelets 150 - 400 K/uL 199   301          Latest Ref Rng & Units 03/15/2022    1:12 AM 03/14/2022    8:30 PM 03/14/2022    4:40 PM  CMP  Glucose 70 - 99 mg/dL 939   030   092    BUN 8 - 23 mg/dL 18   21   25     Creatinine 0.61 - 1.24 mg/dL   3.30   0.76    Sodium 135 - 145 mmol/L 143   141   145    Potassium 3.5 - 5.1 mmol/L 3.7   3.7   4.9    Chloride 98 - 111 mmol/L 117   115   121    CO2 22 - 32 mmol/L 19   19   13     Calcium 8.9 - 10.3 mg/dL 8.6   8.6   8.9    Total Protein 6.5 - 8.1 g/dL 5.0      Total Bilirubin 0.3 - 1.2 mg/dL 0.6      Alkaline Phos 38 - 126 U/L 56  AST 15 - 41 U/L 19      ALT 0 - 44 U/L 13        Imaging: No results found.  Assessment/Plan:   Principal Problem:   DKA (diabetic ketoacidosis) (HCC)   Patient Summary: Craig Young is a 71 y.o. male with past medical history of hypertension, hyperlipidemia, type 2 diabetes mellitus who presented to the ED for blurry vision, nausea, vomiting, confusion after being out of his Ozempic for 3 months and was found to be in DKA on admission.   #Type 2 Diabetes Mellitus #Diabetic ketoacidosis #Anion Gap Metabolic Acidosis #Hypernatremia Anion gap remains closed at 7. Remainder of electrolytes stable. Bicarb slightly low 19. Na 143 today.CBG predominantly in the mid-high 100s. Not eating much so will continue q4 SSI. -Semglee 30 u -moderate SSI -Encourage p.o. hydration and intake -Monitor CBG -PT/OT eval (PT recommends HHPT) -Consult to diabetes coordinator, appreciate assistance   #Lactic Acidosis #Concern for sepsis Bcx no growth for 2 day. Lactic acid downtrended yesterday. Vitals remain stable, afebrile on room air.  Lactic acidosis likely multifactorial likely secondary to poor p.o. intake as well as possibly metformin -Discontinue cefepime and  Vancomycin   #Acute Kidney Injury, resolved Scr 1.01 today -AM BMP -Encourage PO fluid intake   #Esophagitis Continues to complain of dysphagia.  Likely will need EGD so we will touch base with GI once esophagram Results. -Continue fluconazole -SLP to evaluate and treat. -Esophagram   #Hypertension Patient with longstanding hypertension on HCTZ 12.5 mg daily and Lisinopril 20 mg daily. Initial plan was to hold home meds in the setting of severe illness. Latest BP 129/65 -Continue home lisinopril and HCTZ, Cr stable   #Hyperlipidemia Patient with longstanding hyperlipidemia on Crestor 20 mg daily. Initial plan was to hold home meds in the setting of severe illness. -Resume home Crestor   #Cocaine Use Disorder #Alcohol Use Disorder Patient endorses cocaine use approximately once per month. Unclear whether patient was also using alcohol due to encephalopathy.  -TOC consult for substance use -CIWA protocol x72 hours (0,0,0)   #Benign Prostatic Hypertrophy -Resume home Flomax  Diet: Carb-Modified IVF: None,None VTE: Heparin Code: Full PT/OT recs: Pending, none. TOC recs: none   Dispo: Anticipated discharge to Home in 2 days pending glucose control and blood cultures.   Park Pope, MD 03/15/2022, 6:58 AM Pager: [phone]  Please contact the on call pager after 5 pm and on weekends at [phone].

## 2022-03-15 NOTE — Progress Notes (Signed)
MD: can the foley be d/c'd on this patient?  Thank you, nursing

## 2022-03-16 ENCOUNTER — Inpatient Hospital Stay (HOSPITAL_COMMUNITY): Payer: Medicare Other

## 2022-03-16 DIAGNOSIS — E876 Hypokalemia: Secondary | ICD-10-CM

## 2022-03-16 DIAGNOSIS — K209 Esophagitis, unspecified without bleeding: Secondary | ICD-10-CM

## 2022-03-16 DIAGNOSIS — R1319 Other dysphagia: Secondary | ICD-10-CM

## 2022-03-16 DIAGNOSIS — I10 Essential (primary) hypertension: Secondary | ICD-10-CM

## 2022-03-16 LAB — BASIC METABOLIC PANEL
Anion gap: 8 (ref 5–15)
BUN: 11 mg/dL (ref 8–23)
CO2: 21 mmol/L — ABNORMAL LOW (ref 22–32)
Calcium: 8.3 mg/dL — ABNORMAL LOW (ref 8.9–10.3)
Chloride: 111 mmol/L (ref 98–111)
Creatinine, Ser: 0.94 mg/dL (ref 0.61–1.24)
GFR, Estimated: >60 mL/min (ref >60)
Glucose, Bld: 152 mg/dL — ABNORMAL HIGH (ref 70–99)
Potassium: 2.8 mmol/L — ABNORMAL LOW (ref 3.5–5.1)
Sodium: 140 mmol/L (ref 135–145)

## 2022-03-16 LAB — GLUCOSE, CAPILLARY
Glucose-Capillary: 170 mg/dL — ABNORMAL HIGH (ref 70–99)
Glucose-Capillary: 172 mg/dL — ABNORMAL HIGH (ref 70–99)
Glucose-Capillary: 178 mg/dL — ABNORMAL HIGH (ref 70–99)
Glucose-Capillary: 217 mg/dL — ABNORMAL HIGH (ref 70–99)
Glucose-Capillary: 223 mg/dL — ABNORMAL HIGH (ref 70–99)
Glucose-Capillary: 323 mg/dL — ABNORMAL HIGH (ref 70–99)

## 2022-03-16 LAB — CBC
HCT: 36.6 % — ABNORMAL LOW (ref 39.0–52.0)
Hemoglobin: 13 g/dL (ref 13.0–17.0)
MCH: 30.1 pg (ref 26.0–34.0)
MCHC: 35.5 g/dL (ref 30.0–36.0)
MCV: 84.7 fL (ref 80.0–100.0)
Platelets: 168 10*3/uL (ref 150–400)
RBC: 4.32 MIL/uL (ref 4.22–5.81)
RDW: 12.4 % (ref 11.5–15.5)
WBC: 9.5 10*3/uL (ref 4.0–10.5)
nRBC: 0 % (ref 0.0–0.2)

## 2022-03-16 LAB — POTASSIUM: Potassium: 3.2 mmol/L — ABNORMAL LOW (ref 3.5–5.1)

## 2022-03-16 LAB — C-PEPTIDE: C-Peptide: 0.6 ng/mL — ABNORMAL LOW (ref 1.1–4.4)

## 2022-03-16 MED ORDER — POTASSIUM CHLORIDE 20 MEQ PO PACK
40.0000 meq | PACK | ORAL | Status: DC
  Filled 2022-03-16: qty 2

## 2022-03-16 MED ORDER — POTASSIUM CHLORIDE 20 MEQ PO PACK
40.0000 meq | PACK | ORAL | Status: AC
Start: 2022-03-16 — End: 2022-03-16
  Filled 2022-03-16: qty 2

## 2022-03-16 MED ORDER — POTASSIUM CHLORIDE 10 MEQ/100ML IV SOLN
10.0000 meq | INTRAVENOUS | Status: AC
  Administered 2022-03-17 (×3): 10 meq via INTRAVENOUS
  Filled 2022-03-16 (×3): qty 100

## 2022-03-16 MED ORDER — POTASSIUM CHLORIDE 20 MEQ PO PACK: 40.0000 meq | PACK | ORAL | Status: DC

## 2022-03-16 MED ORDER — POTASSIUM CHLORIDE 20 MEQ PO PACK
40.0000 meq | PACK | Freq: Once | ORAL | Status: AC
  Administered 2022-03-17: 40 meq via ORAL
  Filled 2022-03-16: qty 2

## 2022-03-16 MED ORDER — POTASSIUM CHLORIDE 10 MEQ/100ML IV SOLN
10.0000 meq | INTRAVENOUS | Status: AC
  Administered 2022-03-16 (×4): 10 meq via INTRAVENOUS
  Filled 2022-03-16 (×4): qty 100

## 2022-03-16 NOTE — H&P (View-Only) (Signed)
Referring Provider: Hospital team  Primary Care Physician:  Andree Moro, DO Primary Gastroenterologist:  Larwance Rote  Reason for Consultation: Abnormal barium swallow  HPI: Craig Young is a 71 y.o. male with past medical history of poorly controlled diabetes, hypertension, reflux presented to the hospital with generalized weakness and throat irritation.  Was found to have severe hyperglycemia and acidosis on initial assessment along with hypokalemia with potassium of 2.5.  Calcium of 4.8.  Normal LFTs.  Leukocytosis on CBC.  Probably hemoconcentration.  Patient was complaining of dysphagia to solids and liquids for last 1 month.  CT abdomen pelvis with contrast on Mar 09, 2022 did showed evidence of distal esophagitis and constipation.  Barium swallow today showed possible linear ulcer in the distal esophagus.  GI is consulted for EGD.  Patient seen and examined at bedside.  Is complaining of intermittent trouble swallowing for last 1 to 2 months.  Denies any abdominal pain.  Denies any diarrhea or constipation.  Denies any blood in the stool or black stool.  Complaining of some weight loss but he does not weigh himself regularly.   Last colonoscopy in June 2017 by Dr. Penelope Coop showed small tubular adenoma and repeat was recommended in 5 years.  Past Medical History:  Diagnosis Date   HTN (hypertension), benign    Hyperlipidemia    Type 2 diabetes, uncontrolled, with neuropathy     History reviewed. No pertinent surgical history.  Prior to Admission medications   Medication Sig Start Date End Date Taking? Authorizing Provider  aspirin EC 81 MG tablet Take 81 mg by mouth daily.   Yes [provider]  Coenzyme Q10 (COQ-10) 100 MG capsule Take 100 mg by mouth daily.   Yes [provider]  fluconazole (DIFLUCAN) 200 MG tablet Take 1 tablet (200 mg total) by mouth daily for 21 days. 03/09/22 03/30/22 Yes Carmin Muskrat, MD  hydrochlorothiazide (MICROZIDE) 12.5 MG capsule  Take 12.5 mg by mouth daily.   Yes [provider]  lisinopril (ZESTRIL) 20 MG tablet Take 20 mg by mouth daily.   Yes [provider]  metFORMIN (GLUCOPHAGE) 1000 MG tablet Take 1,000 mg by mouth 2 (two) times daily with a meal.   Yes [provider]  OZEMPIC, 1 MG/DOSE, 4 MG/3ML SOPN Inject 1 mg into the muscle once a week. Mondays 12/22/20  Yes [provider]  rosuvastatin (CRESTOR) 20 MG tablet Take 1 tablet (20 mg total) by mouth daily. 03/21/17  Yes Minus Liberty, MD  tamsulosin (FLOMAX) 0.4 MG CAPS capsule TAKE 2 CAPSULES (0.8 MG TOTAL) BY MOUTH DAILY AFTER BREAKFAST. 05/10/18  Yes Winfrey, Jenne Pane, MD  Blood Glucose Monitoring Suppl (ONETOUCH VERIO IQ SYSTEM) W/DEVICE KIT Check blood sugar once daily as instructed 02/05/14   Oval Linsey, MD  glucose blood (ONETOUCH VERIO) test strip Use as instructed to check blood sugar 3 times per day.  Dx code E11.40. Non insulin dependent 03/21/17   Minus Liberty, MD  Lancets [facility] ULTRASOFT) lancets Use as instructed 03/21/17   Minus Liberty, MD    Scheduled Meds:  Chlorhexidine Gluconate Cloth  6 each Topical Daily   fluconazole  200 mg Oral Daily   folic acid  1 mg Oral Daily   heparin  5,000 Units Subcutaneous Q8H   hydrochlorothiazide  12.5 mg Oral Daily   insulin aspart  0-9 Units Subcutaneous Q4H   insulin glargine-yfgn  30 Units Subcutaneous Daily   lisinopril  20 mg Oral Daily   multivitamin with minerals  1 tablet Oral Daily   pantoprazole (PROTONIX) IV  40 mg Intravenous Q24H   potassium chloride  40 mEq Oral Q4H   rosuvastatin  20 mg Oral Daily   tamsulosin  0.8 mg Oral QPC breakfast   thiamine  100 mg Oral Daily   Or   thiamine  100 mg Intravenous Daily   Continuous Infusions: PRN Meds:.dextrose, LORazepam **OR** LORazepam  Allergies as of 03/13/2022 - Review Complete 03/13/2022  Allergen Reaction Noted   Atorvastatin Other (See Comments) 12/06/2016    History  reviewed. No pertinent family history.  Social History   Socioeconomic History   Marital status: Married    Spouse name: Not on file   Number of children: Not on file   Years of education: 14   Highest education level: Not on file  Occupational History   Occupation: Pension scheme manager: UNEMPLOYED  Tobacco Use   Smoking status: Never   Smokeless tobacco: Never  Substance and Sexual Activity   Alcohol use: Yes    Alcohol/week: 0.0 standard drinks   Drug use: No   Sexual activity: Not on file  Other Topics Concern   Not on file  Social History Narrative   Not on file   Social Determinants of Health   Financial Resource Strain: Not on file  Food Insecurity: Not on file  Transportation Needs: Not on file  Physical Activity: Not on file  Stress: Not on file  Social Connections: Not on file  Intimate Partner Violence: Not on file    Review of Systems: All negative except as stated above in HPI.  Physical Exam: Vital signs: Vitals:   03/16/22 0840 03/16/22 1123  BP: (!) 143/69 136/74  Pulse: 79 87  Resp:  18  Temp:  (!) 97.4 F (36.3 C)  SpO2:  99%     General:   Alert,  Well-developed, well-nourished, pleasant and cooperative in NAD Lungs:  Clear throughout to auscultation.   No wheezes, crackles, or rhonchi. No acute distress. Heart:  Regular rate and rhythm; no murmurs, clicks, rubs,  or gallops. Abdomen: Mild distended, nontender, bowel sounds present.  No peritoneal signs Rectal:  Deferred  GI:  Lab Results: Recent Labs    03/13/22 1919 03/15/22 0112 03/16/22 0232  WBC 26.3* 15.8*  15.7* 9.5  HGB 15.8 13.8  13.5 13.0  HCT 46.2 39.0  39.6 36.6*  PLT 301 198  199 168   BMET Recent Labs    03/14/22 2030 03/15/22 0112 03/16/22 0232  NA 141 143 140  K 3.7 3.7 2.8*  CL 115* 117* 111  CO2 19* 19* 21*  GLUCOSE 364* 164* 152*  BUN 21 18 11   CREATININE 1.11 1.01 0.94  CALCIUM 8.6* 8.6* 8.3*   LFT Recent Labs    03/15/22 0112  PROT 5.0*   ALBUMIN 2.8*  AST 19  ALT 13  ALKPHOS 56  BILITOT 0.6   PT/INR No results for input(s): LABPROT, INR in the last 72 hours.   Studies/Results: DG ESOPHAGUS W SINGLE CM (SOL OR THIN BA)  Result Date: 03/16/2022 CLINICAL DATA:  71 year old male with history of heartburn, currently hospitalized due to DKA and AKI, who presents due to dysphagia with solid and liquid for 1 month. Patient reports food getting stuck at epigastric region. Previous CT abdomen pelvis on 03/09/2022 showed distal esophageal wall thickening and adjacent stranding suggesting esophagitis. Request for esophagram for further evaluation. EXAM: ESOPHAGUS/BARIUM SWALLOW/TABLET STUDY TECHNIQUE: Combined double and single contrast examination was performed  using effervescent crystals, high-density barium, and thin liquid barium. This exam was performed by Durenda Guthrie, PA-C, and was supervised and interpreted by Arne Cleveland, MD. FLUOROSCOPY: Radiation Exposure Index (as provided by the fluoroscopic device): 13.70 mGy Kerma COMPARISON:  None Available. FINDINGS: Swallowing: Appears normal. No vestibular penetration or aspiration seen. Pharynx: Unremarkable. Esophagus: Mucosal irregularity seen at the distal esophagus, with a shallow linear ulceration suspected. No high-grade stricture. Esophageal motility: Within normal limits. Hiatal Hernia: None. Gastroesophageal reflux: Moderate gastroesophageal reflux seen. Ingested 67m barium tablet: Passed normally. Other: None. IMPRESSION: 1. Mucosal irregularity with possible linear ulceration in the distal esophagus. Consider endoscopic evaluation to differentiate esophagitis from mucosal lesion. 2.  Moderate gastroesophageal reflux 3.  No hiatal hernia Electronically Signed   By: DLucrezia EuropeM.D.   On: 03/16/2022 14:27    Impression/Plan: -Intermittent dysphagia for last 1 to 2 months.  CT scan showing esophagitis with recent barium swallow concerning for distal esophageal ulcer. -Uncontrolled  diabetes with DKA.  Improving -Hypokalemia -History of adenomatous polyp  Recommendation ------------------------ -Plan for EGD tomorrow with possible dilation depending on endoscopic findings. -Okay to have soft diet today.  Keep n.p.o. past midnight -Replace potassium to keep it more than 3.5 -Continue Protonix  Risks (bleeding, infection, bowel perforation that could require surgery, sedation-related changes in cardiopulmonary systems), benefits (identification and possible treatment of source of symptoms, exclusion of certain causes of symptoms), and alternatives (watchful waiting, radiographic imaging studies, empiric medical treatment)  were explained to patient/family in detail and patient wishes to proceed.     LOS: 3 days   POtis Brace MD, FACP 03/16/2022, 3:44 PM  Contact #  [phone]

## 2022-03-16 NOTE — Progress Notes (Addendum)
Subjective:  Overnight Events: No acute events or concerns overnight.  The patient was seen and evaluated on rounds this morning. He states that he is feeling much better than when he originally came in. He does state that he has continued to have a mildly sore throat but states this is improved with cold liquids. Continues to be cautious about solid food intake given the feeling of it being "stuck" when attempting to eat.   Objective:  Vital signs in last 24 hours: Vitals:   03/15/22 2336 03/16/22 0000 03/16/22 0432 03/16/22 0436  BP:   134/71   Pulse: 80 83  81  Resp: (!) 26 (!) 25  (!) 25  Temp:   98 F (36.7 C)   TempSrc:   Oral   SpO2: 100% 98% 99% 100%  Weight:      Height:       Weight change:   Intake/Output Summary (Last 24 hours) at 03/16/2022 8921 Last data filed at 03/16/2022 0400 Gross per 24 hour  Intake --  Output 975 ml  Net -975 ml    Physical Exam   Constitutional: well appearing and sitting in bed, in no acute distress HENT: normocephalic atraumatic, mucous membranes moist Eyes: pupils equal and round, conjunctiva non-erythematous Cardiovascular: regular rate with normal rhythm, no m/r/g Pulmonary/Chest: normal work of breathing on room air, lungs clear to auscultation bilaterally Abdominal: soft, non-tender, non-distended, bowel sounds present MSK: normal bulk and tone, 5/5 strength in bilateral upper and lower extremities.  Skin: warm and dry. Neurological: alert and answering questions appropriately. Psych: appropriate mood and affect   Assessment/Plan:  Principal Problem:   DKA (diabetic ketoacidosis) (HCC) Active Problems:   Hypokalemia  Craig Young is a 71 y.o. male with past medical history of hypertension, hyperlipidemia, type 2 diabetes mellitus who presented to the ED for blurry vision, nausea, vomiting, confusion after being out of his Ozempic for 3 months and was found to be in DKA on admission.   #Type 2 Diabetes  Mellitus #Diabetic ketoacidosis #Anion Gap Metabolic Acidosis #Hypernatremia, resolved #Hypokalemia Anion gap remains closed at 9. Patient has a new hypokalemia today down to 2.8 from 3.7 yesterday. Bicarb slightly low 21. Na 140 today.CBG predominantly in the mid-high 100s. Not eating much so will continue q4 SSI. -Semglee 30 u -moderate SSI -Encourage p.o. hydration and intake -Monitor CBG -PT/OT eval (PT recommends HHPT) -Consult to diabetes coordinator, appreciate assistance -Replete potassium   #Lactic Acidosis, resolved Bcx no growth for 2 day. Lactic acid downtrended yesterday. Vitals remain stable, afebrile on room air.  Lactic acidosis likely multifactorial likely secondary to poor p.o. intake, hypothermia, as well as possibly metformin. -Discontinue cefepime and Vancomycin given negative blood cultures   #Acute Kidney Injury, resolved Scr 0.94 today -AM BMP -Encourage PO fluid intake   #Esophagitis Continues to complain of dysphagia.  Plan for esophagram for further evaluation this afternoon and assess next steps based on results. -Continue fluconazole -SLP to evaluate and treat. -Esophagram   #Hypertension Patient with longstanding hypertension on HCTZ 12.5 mg daily and Lisinopril 20 mg daily. Initial plan was to hold home meds in the setting of severe illness. Latest BP 134/71 -Continue home lisinopril and HCTZ, Cr stable   #Hyperlipidemia Patient with longstanding hyperlipidemia on Crestor 20 mg daily. Initial plan was to hold home meds in the setting of severe illness. -Continue home Crestor   #Cocaine Use Disorder #Alcohol Use Disorder Patient endorses cocaine use approximately once per month. Unclear whether patient  was also using alcohol due to encephalopathy.  -TOC consult for substance use -CIWA protocol x72 hours (0,0,0), discontinued   #Benign Prostatic Hypertrophy -Continue home Flomax  Diet: Carb-modified VTE: Heparin IVF: None Code: Full    Prior to Admission Living Arrangement: Home Anticipated Discharge Location: Home Barriers to Discharge: Medical stability Dispo: Anticipated discharge in approximately 1-2 day(s).    LOS: 3 days   Bishop Limbo, Medical Student 03/16/2022, 6:28 AM   Attestation for Student Documentation:  I personally was present and performed or re-performed the history, physical exam and medical decision-making activities of this service and have verified that the service and findings are accurately documented in the student's note.  Park Pope, MD 03/16/2022, 1:20 PM

## 2022-03-16 NOTE — Consult Note (Signed)
Referring Provider: Hospital team  Primary Care Physician:  Andree Moro, DO Primary Gastroenterologist:  Larwance Rote  Reason for Consultation: Abnormal barium swallow  HPI: Craig Young is a 71 y.o. male with past medical history of poorly controlled diabetes, hypertension, reflux presented to the hospital with generalized weakness and throat irritation.  Was found to have severe hyperglycemia and acidosis on initial assessment along with hypokalemia with potassium of 2.5.  Calcium of 4.8.  Normal LFTs.  Leukocytosis on CBC.  Probably hemoconcentration.  Patient was complaining of dysphagia to solids and liquids for last 1 month.  CT abdomen pelvis with contrast on Mar 09, 2022 did showed evidence of distal esophagitis and constipation.  Barium swallow today showed possible linear ulcer in the distal esophagus.  GI is consulted for EGD.  Patient seen and examined at bedside.  Is complaining of intermittent trouble swallowing for last 1 to 2 months.  Denies any abdominal pain.  Denies any diarrhea or constipation.  Denies any blood in the stool or black stool.  Complaining of some weight loss but he does not weigh himself regularly.   Last colonoscopy in June 2017 by Dr. Penelope Coop showed small tubular adenoma and repeat was recommended in 5 years.  Past Medical History:  Diagnosis Date   HTN (hypertension), benign    Hyperlipidemia    Type 2 diabetes, uncontrolled, with neuropathy     History reviewed. No pertinent surgical history.  Prior to Admission medications   Medication Sig Start Date End Date Taking? Authorizing Provider  aspirin EC 81 MG tablet Take 81 mg by mouth daily.   Yes [provider]  Coenzyme Q10 (COQ-10) 100 MG capsule Take 100 mg by mouth daily.   Yes [provider]  fluconazole (DIFLUCAN) 200 MG tablet Take 1 tablet (200 mg total) by mouth daily for 21 days. 03/09/22 03/30/22 Yes Carmin Muskrat, MD  hydrochlorothiazide (MICROZIDE) 12.5 MG capsule  Take 12.5 mg by mouth daily.   Yes [provider]  lisinopril (ZESTRIL) 20 MG tablet Take 20 mg by mouth daily.   Yes [provider]  metFORMIN (GLUCOPHAGE) 1000 MG tablet Take 1,000 mg by mouth 2 (two) times daily with a meal.   Yes [provider]  OZEMPIC, 1 MG/DOSE, 4 MG/3ML SOPN Inject 1 mg into the muscle once a week. Mondays 12/22/20  Yes [provider]  rosuvastatin (CRESTOR) 20 MG tablet Take 1 tablet (20 mg total) by mouth daily. 03/21/17  Yes Minus Liberty, MD  tamsulosin (FLOMAX) 0.4 MG CAPS capsule TAKE 2 CAPSULES (0.8 MG TOTAL) BY MOUTH DAILY AFTER BREAKFAST. 05/10/18  Yes Winfrey, Jenne Pane, MD  Blood Glucose Monitoring Suppl (ONETOUCH VERIO IQ SYSTEM) W/DEVICE KIT Check blood sugar once daily as instructed 02/05/14   Oval Linsey, MD  glucose blood (ONETOUCH VERIO) test strip Use as instructed to check blood sugar 3 times per day.  Dx code E11.40. Non insulin dependent 03/21/17   Minus Liberty, MD  Lancets [facility] ULTRASOFT) lancets Use as instructed 03/21/17   Minus Liberty, MD    Scheduled Meds:  Chlorhexidine Gluconate Cloth  6 each Topical Daily   fluconazole  200 mg Oral Daily   folic acid  1 mg Oral Daily   heparin  5,000 Units Subcutaneous Q8H   hydrochlorothiazide  12.5 mg Oral Daily   insulin aspart  0-9 Units Subcutaneous Q4H   insulin glargine-yfgn  30 Units Subcutaneous Daily   lisinopril  20 mg Oral Daily   multivitamin with minerals  1 tablet Oral Daily   pantoprazole (PROTONIX) IV  40 mg Intravenous Q24H   potassium chloride  40 mEq Oral Q4H   rosuvastatin  20 mg Oral Daily   tamsulosin  0.8 mg Oral QPC breakfast   thiamine  100 mg Oral Daily   Or   thiamine  100 mg Intravenous Daily   Continuous Infusions: PRN Meds:.dextrose, LORazepam **OR** LORazepam  Allergies as of 03/13/2022 - Review Complete 03/13/2022  Allergen Reaction Noted   Atorvastatin Other (See Comments) 12/06/2016    History  reviewed. No pertinent family history.  Social History   Socioeconomic History   Marital status: Married    Spouse name: Not on file   Number of children: Not on file   Years of education: 14   Highest education level: Not on file  Occupational History   Occupation: Pension scheme manager: UNEMPLOYED  Tobacco Use   Smoking status: Never   Smokeless tobacco: Never  Substance and Sexual Activity   Alcohol use: Yes    Alcohol/week: 0.0 standard drinks   Drug use: No   Sexual activity: Not on file  Other Topics Concern   Not on file  Social History Narrative   Not on file   Social Determinants of Health   Financial Resource Strain: Not on file  Food Insecurity: Not on file  Transportation Needs: Not on file  Physical Activity: Not on file  Stress: Not on file  Social Connections: Not on file  Intimate Partner Violence: Not on file    Review of Systems: All negative except as stated above in HPI.  Physical Exam: Vital signs: Vitals:   03/16/22 0840 03/16/22 1123  BP: (!) 143/69 136/74  Pulse: 79 87  Resp:  18  Temp:  (!) 97.4 F (36.3 C)  SpO2:  99%     General:   Alert,  Well-developed, well-nourished, pleasant and cooperative in NAD Lungs:  Clear throughout to auscultation.   No wheezes, crackles, or rhonchi. No acute distress. Heart:  Regular rate and rhythm; no murmurs, clicks, rubs,  or gallops. Abdomen: Mild distended, nontender, bowel sounds present.  No peritoneal signs Rectal:  Deferred  GI:  Lab Results: Recent Labs    03/13/22 1919 03/15/22 0112 03/16/22 0232  WBC 26.3* 15.8*  15.7* 9.5  HGB 15.8 13.8  13.5 13.0  HCT 46.2 39.0  39.6 36.6*  PLT 301 198  199 168   BMET Recent Labs    03/14/22 2030 03/15/22 0112 03/16/22 0232  NA 141 143 140  K 3.7 3.7 2.8*  CL 115* 117* 111  CO2 19* 19* 21*  GLUCOSE 364* 164* 152*  BUN 21 18 11   CREATININE 1.11 1.01 0.94  CALCIUM 8.6* 8.6* 8.3*   LFT Recent Labs    03/15/22 0112  PROT 5.0*   ALBUMIN 2.8*  AST 19  ALT 13  ALKPHOS 56  BILITOT 0.6   PT/INR No results for input(s): LABPROT, INR in the last 72 hours.   Studies/Results: DG ESOPHAGUS W SINGLE CM (SOL OR THIN BA)  Result Date: 03/16/2022 CLINICAL DATA:  71 year old male with history of heartburn, currently hospitalized due to DKA and AKI, who presents due to dysphagia with solid and liquid for 1 month. Patient reports food getting stuck at epigastric region. Previous CT abdomen pelvis on 03/09/2022 showed distal esophageal wall thickening and adjacent stranding suggesting esophagitis. Request for esophagram for further evaluation. EXAM: ESOPHAGUS/BARIUM SWALLOW/TABLET STUDY TECHNIQUE: Combined double and single contrast examination was performed  using effervescent crystals, high-density barium, and thin liquid barium. This exam was performed by Durenda Guthrie, PA-C, and was supervised and interpreted by Arne Cleveland, MD. FLUOROSCOPY: Radiation Exposure Index (as provided by the fluoroscopic device): 13.70 mGy Kerma COMPARISON:  None Available. FINDINGS: Swallowing: Appears normal. No vestibular penetration or aspiration seen. Pharynx: Unremarkable. Esophagus: Mucosal irregularity seen at the distal esophagus, with a shallow linear ulceration suspected. No high-grade stricture. Esophageal motility: Within normal limits. Hiatal Hernia: None. Gastroesophageal reflux: Moderate gastroesophageal reflux seen. Ingested 66m barium tablet: Passed normally. Other: None. IMPRESSION: 1. Mucosal irregularity with possible linear ulceration in the distal esophagus. Consider endoscopic evaluation to differentiate esophagitis from mucosal lesion. 2.  Moderate gastroesophageal reflux 3.  No hiatal hernia Electronically Signed   By: DLucrezia EuropeM.D.   On: 03/16/2022 14:27    Impression/Plan: -Intermittent dysphagia for last 1 to 2 months.  CT scan showing esophagitis with recent barium swallow concerning for distal esophageal ulcer. -Uncontrolled  diabetes with DKA.  Improving -Hypokalemia -History of adenomatous polyp  Recommendation ------------------------ -Plan for EGD tomorrow with possible dilation depending on endoscopic findings. -Okay to have soft diet today.  Keep n.p.o. past midnight -Replace potassium to keep it more than 3.5 -Continue Protonix  Risks (bleeding, infection, bowel perforation that could require surgery, sedation-related changes in cardiopulmonary systems), benefits (identification and possible treatment of source of symptoms, exclusion of certain causes of symptoms), and alternatives (watchful waiting, radiographic imaging studies, empiric medical treatment)  were explained to patient/family in detail and patient wishes to proceed.     LOS: 3 days   POtis Brace MD, FACP 03/16/2022, 3:44 PM  Contact #  [phone]

## 2022-03-16 NOTE — Progress Notes (Signed)
SLP Cancellation Note  Patient Details Name: Craig Young MRN: [id number] DOB: December 21, 1950   Cancelled treatment:       Reason Eval/Treat Not Completed: Other (comment). Pt NPO for esophagram. Will f/u as needed   Romanita Fager, Riley Nearing 03/16/2022, 10:09 AM

## 2022-03-16 NOTE — Care Management Important Message (Signed)
Important Message  Patient Details  Name: Craig Young MRN: [id number] Date of Birth: 07-20-1951   Medicare Important Message Given:  Yes     Dorena Bodo 03/16/2022, 3:22 PM

## 2022-03-16 NOTE — Progress Notes (Signed)
Mobility Specialist Progress Note:   03/16/22 1204  Mobility  Activity Ambulated with assistance in hallway  Level of Assistance Standby assist, set-up cues, supervision of patient - no hands on  Assistive Device None  Distance Ambulated (ft) 450 ft  Activity Response Tolerated well  $Mobility charge 1 Mobility   Pt received in bed willing to participate in mobility. No complaints of pain. Left in bed with call bell in reach and all needs met.   [facility] Tahiri Shareef Mobility Specialist

## 2022-03-16 NOTE — Progress Notes (Signed)
PT Cancellation Note  Patient Details Name: Craig Young MRN: [id number] DOB: [birth year]   Cancelled Treatment:    Reason Eval/Treat Not Completed: Patient declined, no reason specified Pt refused treatment due to not having food for 24 hrs and diet being restricted for impending endoscopy. Will follow.   Havery Moros, [place], SPT Acute Rehabilitation Services Office: [place] 03/16/2022, 2:40 PM

## 2022-03-17 ENCOUNTER — Encounter (HOSPITAL_COMMUNITY): Payer: Self-pay | Admitting: Student

## 2022-03-17 ENCOUNTER — Encounter (HOSPITAL_COMMUNITY)
Admission: EM | Disposition: A | Discharge status: Home Health | Payer: Self-pay | Source: Home / Self Care | Attending: Internal Medicine

## 2022-03-17 ENCOUNTER — Inpatient Hospital Stay (HOSPITAL_COMMUNITY): Payer: Medicare Other | Admitting: Anesthesiology

## 2022-03-17 DIAGNOSIS — K208 Other esophagitis without bleeding: Secondary | ICD-10-CM

## 2022-03-17 DIAGNOSIS — K209 Esophagitis, unspecified without bleeding: Secondary | ICD-10-CM

## 2022-03-17 DIAGNOSIS — I10 Essential (primary) hypertension: Secondary | ICD-10-CM

## 2022-03-17 DIAGNOSIS — E119 Type 2 diabetes mellitus without complications: Secondary | ICD-10-CM

## 2022-03-17 DIAGNOSIS — K2970 Gastritis, unspecified, without bleeding: Secondary | ICD-10-CM

## 2022-03-17 HISTORY — PX: BIOPSY: SHX5522

## 2022-03-17 HISTORY — PX: ESOPHAGOGASTRODUODENOSCOPY (EGD) WITH PROPOFOL: SHX5813

## 2022-03-17 LAB — BASIC METABOLIC PANEL
Anion gap: 6 (ref 5–15)
BUN: 8 mg/dL (ref 8–23)
CO2: 24 mmol/L (ref 22–32)
Calcium: 8.2 mg/dL — ABNORMAL LOW (ref 8.9–10.3)
Chloride: 106 mmol/L (ref 98–111)
Creatinine, Ser: 0.86 mg/dL (ref 0.61–1.24)
GFR, Estimated: >60 mL/min (ref >60)
Glucose, Bld: 146 mg/dL — ABNORMAL HIGH (ref 70–99)
Potassium: 3.4 mmol/L — ABNORMAL LOW (ref 3.5–5.1)
Sodium: 136 mmol/L (ref 135–145)

## 2022-03-17 LAB — GLUCOSE, CAPILLARY
Glucose-Capillary: 164 mg/dL — ABNORMAL HIGH (ref 70–99)
Glucose-Capillary: 192 mg/dL — ABNORMAL HIGH (ref 70–99)
Glucose-Capillary: 152 mg/dL — ABNORMAL HIGH (ref 70–99)
Glucose-Capillary: 144 mg/dL — ABNORMAL HIGH (ref 70–99)
Glucose-Capillary: 248 mg/dL — ABNORMAL HIGH (ref 70–99)

## 2022-03-17 LAB — POTASSIUM: Potassium: 3.3 mmol/L — ABNORMAL LOW (ref 3.5–5.1)

## 2022-03-17 SURGERY — ESOPHAGOGASTRODUODENOSCOPY (EGD) WITH PROPOFOL
Anesthesia: Monitor Anesthesia Care

## 2022-03-17 MED ORDER — SUCRALFATE 1 G PO TABS
1.0000 g | ORAL_TABLET | Freq: Three times a day (TID) | ORAL | Status: DC
  Administered 2022-03-17 (×2): 1 g via ORAL
  Filled 2022-03-17 (×4): qty 1

## 2022-03-17 MED ORDER — PANTOPRAZOLE SODIUM 40 MG IV SOLR: 40.0000 mg | Freq: Two times a day (BID) | INTRAVENOUS | Status: DC

## 2022-03-17 MED ORDER — ONETOUCH VERIO FLEX SYSTEM W/DEVICE KIT: 1.0000 | PACK | Freq: Every day | 0 refills | Status: AC

## 2022-03-17 MED ORDER — ADULT MULTIVITAMIN W/MINERALS CH: 1.0000 | ORAL_TABLET | Freq: Every day | ORAL | Status: AC

## 2022-03-17 MED ORDER — GLUCAGON HCL RDNA (DIAGNOSTIC) 1 MG IJ SOLR: INTRAMUSCULAR | Status: DC | PRN

## 2022-03-17 MED ORDER — POTASSIUM CHLORIDE 10 MEQ/100ML IV SOLN
10.0000 meq | INTRAVENOUS | Status: DC
  Administered 2022-03-17 (×3): 10 meq via INTRAVENOUS
  Filled 2022-03-17 (×2): qty 100

## 2022-03-17 MED ORDER — SUCRALFATE 1 G PO TABS: 1.0000 g | ORAL_TABLET | Freq: Three times a day (TID) | ORAL | 0 refills | Status: AC

## 2022-03-17 MED ORDER — INSULIN DETEMIR 100 UNIT/ML FLEXPEN
30.0000 [IU] | PEN_INJECTOR | Freq: Every day | SUBCUTANEOUS | 2 refills | Status: AC
Start: 2022-03-17 — End: ?

## 2022-03-17 MED ORDER — PROPOFOL 10 MG/ML IV BOLUS
INTRAVENOUS | Status: DC | PRN
  Administered 2022-03-17 (×3): 20 mg via INTRAVENOUS

## 2022-03-17 MED ORDER — PANTOPRAZOLE SODIUM 40 MG PO TBEC: 40.0000 mg | DELAYED_RELEASE_TABLET | Freq: Two times a day (BID) | ORAL | 0 refills | Status: DC

## 2022-03-17 MED ORDER — ONETOUCH ULTRASOFT LANCETS MISC: 1.0000 | Freq: Every day | 0 refills | Status: AC

## 2022-03-17 MED ORDER — INSULIN PEN NEEDLE 32G X 4 MM MISC: 1.0000 | Freq: Every day | 0 refills | Status: AC

## 2022-03-17 MED ORDER — POTASSIUM CHLORIDE 10 MEQ/100ML IV SOLN
10.0000 meq | INTRAVENOUS | Status: AC
  Filled 2022-03-17: qty 100

## 2022-03-17 MED ORDER — SODIUM CHLORIDE 0.9 % IV SOLN
INTRAVENOUS | Status: DC
Start: 2022-03-17 — End: 2022-03-17

## 2022-03-17 MED ORDER — PROPOFOL 500 MG/50ML IV EMUL
INTRAVENOUS | Status: DC | PRN
  Administered 2022-03-17: 100 ug/kg/min via INTRAVENOUS

## 2022-03-17 SURGICAL SUPPLY — 15 items

## 2022-03-17 NOTE — Progress Notes (Signed)
Patient discharging home. IVs removed without complications. Tele removed and CCMD notified. Discharge instructions given and medication administration discussed. All questions answered. Waiting for ride.  Craig [name]

## 2022-03-17 NOTE — Anesthesia Procedure Notes (Signed)
Procedure Name: MAC Date/Time: 03/17/2022 10:24 AM Performed by: Jenne Campus, CRNA Pre-anesthesia Checklist: Patient identified, Emergency Drugs available, Suction available and Patient being monitored Oxygen Delivery Method: Nasal cannula

## 2022-03-17 NOTE — Progress Notes (Signed)
Physical Therapy Treatment Patient Details Name: Craig [name] MRN: [id number] DOB: 12-Aug-1951 Today's Date: 03/17/2022   History of Present Illness The pt is a 71 yo male presenting 5/27 with generalized weakness. Pt found to have AKI and DKA. PMH includes: poorly controlled DM II (3 months without insulin use reccently), HTN, HLD, BPH, and cocaine use.    PT Comments    Pt received in supine, agreeable to limited bed-level session for review of LE HEP. Pt instructed on benefits of gradually increased mobility within tolerance, completion of HEP (link below), supine seated and standing exercises for strength and balance with modifications discussed on certain exercises to emphasis endurance vs strength building. Per chart review, pt refusing DME or HHPT so pt given HEP handout and encouraged him to continue at home to reduce fall risk and reduce risk of needing readmission. Pt continues to benefit from PT services to progress toward functional mobility goals.    Recommendations for follow up therapy are one component of a multi-disciplinary discharge planning process, led by the attending physician.  Recommendations may be updated based on patient status, additional functional criteria and insurance authorization.  Follow Up Recommendations  Home health PT     Assistance Recommended at Discharge Intermittent Supervision/Assistance  Patient can return home with the following A little help with walking and/or transfers;Assistance with cooking/housework;A little help with bathing/dressing/bathroom;Help with stairs or ramp for entrance   Equipment Recommendations  Other (comment) (shower seat, pt refusing to trial cane)    Recommendations for Other Services       Precautions / Restrictions Precautions Precautions: Fall Restrictions Weight Bearing Restrictions: No     Mobility  Bed Mobility Overal bed mobility: Modified Independent             General bed mobility comments:  pt performs supine to long sitting unassisted but defers OOB    Transfers                   General transfer comment: pt refusing, wanting to wait until DC        Balance Overall balance assessment: Mild deficits observed, not formally tested                                          Cognition Arousal/Alertness: Awake/alert Behavior During Therapy: WFL for tasks assessed/performed Overall Cognitive Status: Within Functional Limits for tasks assessed                                 General Comments: pt with slightly flat affect, reports eager to DC and not needing to get up for toileting, pt agreeable to limited bed-level session as he is preparing for DC and wants to save his energy. Education provided about benefits of mobility and fall risk prevention/opportunity to assess for DME needs but pt continues to defer OOB mobility.        Exercises Other Exercises Other Exercises: supine BLE AROM: bridges (emphasis on core activation), SLR, hip abduction, ankle pumps, heel slides x1-5 reps ea (pt deferring x10 reps as encouraged to) Other Exercises: HEP handout given: [email or web address]  Access Code: [email or web address] Other Exercises: verbal/visual demo for chair push-ups and LAQ as well as standing therex: high knee marches, mini squats, full tandem stance with 1-2 UE support for balance, hamstring curls and single leg  stance with 1-2 UE support for balance with modifications of increased reps if performing for endurance vs strengthening    General Comments General comments (skin integrity, edema, etc.): VSS per monitor, no acute s/sx distress with bed-level mobility, pt refusing transfers OOB      Pertinent Vitals/Pain Pain Assessment Pain Assessment: No/denies pain           PT Goals (current goals can now be found in the care plan section) Acute Rehab PT Goals Patient Stated Goal: regain energy PT Goal Formulation: With patient Time  For Goal Achievement: 03/28/22 Progress towards PT goals: Not progressing toward goals - comment (self-limiting mobility during PT session)    Frequency    Min 3X/week      PT Plan Current plan remains appropriate       AM-PAC PT "6 Clicks" Mobility   Outcome Measure  Help needed turning from your back to your side while in a flat bed without using bedrails?: None Help needed moving from lying on your back to sitting on the side of a flat bed without using bedrails?: None Help needed moving to and from a bed to a chair (including a wheelchair)?: A Little (per RN report (pt refusing to perform this and all items below)) Help needed standing up from a chair using your arms (e.g., wheelchair or bedside chair)?: A Little Help needed to walk in hospital room?: A Little Help needed climbing 3-5 steps with a railing? : A Little 6 Click Score: 20    End of Session   Activity Tolerance: Patient limited by fatigue;Other (comment) (pt refusing OOB/EOB mobility) Patient left: in bed;with call bell/phone within reach Nurse Communication: Mobility status;Other (comment) (pt refusing OOB asking about going over DC paperwork) PT Visit Diagnosis: Other abnormalities of gait and mobility (R26.89);Muscle weakness (generalized) (M62.81)     Time: 0762-2633 PT Time Calculation (min) (ACUTE ONLY): 9 min  Charges:  $Therapeutic Exercise: 8-22 mins                     Evertt Chouinard P., PTA Acute Rehabilitation Services Secure Chat Preferred 9a-5:30pm Office: [phone]    Dorathy Kinsman [facility] 03/17/2022, 5:16 PM

## 2022-03-17 NOTE — Discharge Summary (Shared)
Name: Craig [name] MRN: [id number] DOB: 17-Jul-1951 71 y.o. PCP: Andree Moro, DO  Date of Admission: 03/13/2022 11:47 AM Date of Discharge: 03/17/22 Attending Physician: Campbell Riches, MD  Discharge Diagnosis: Type 2 Diabetes, Diabetic Ketoacidosis, Anion Gap Metabolic Acidosis Hypernatremia Hypokalemia Lactic Acidosis Acute Kidney Injury Esophagitis Hypertension Hyperlipidemia Cocaine Use Disorder, Alcohol Use Disorder Benign Prostatic Hypertrophy  Discharge Medications: Allergies as of 03/17/2022       Reactions   Atorvastatin Other (See Comments)   Myalgias/cramps     Med Rec must be completed prior to using this [facility]***       Disposition and follow-up:   [name] was discharged from [facility] in Stable condition.  At the hospital follow up visit please address:  1.  A1C, insulin adherence, esophagitis, hypokalemia  2.  Labs / imaging needed at time of follow-up: BMP, glucose, A1C  3.  Pending labs/ test needing follow-up: None  Follow-up Appointments:  Follow-up Information     Gastroenterology, [facility]. Schedule an appointment as soon as possible for a visit in 2 month(s).   Why: Follow-up for esophagitis and to schedule repeat EGD Contact information: [address] [place] [address] [phone]                 Hospital Course by problem list: Type 2 Diabetes, Diabetic Ketoacidosis, Anion Gap Metabolic Acidosis The patient has history of type 2 diabetes on Ozempic 1 mg weekly and Metformin 1000 mg BID. He presented to the ED complaining of nausea, blurry vision, and polyuria after he ran out of his Ozempic for 3 months. His labs were notable for glucose of 800,  elevated ketoacids, lactic acid of 7.0, anion gap of 36, bicarb of 9, pH of 7.099, and corrected sodium of 153. The patient was placed on Endotool and was initially given 0.45% saline given his hyponatremia. He also received LR  and normal saline. The patient's glucose steadily decreased, and his gap closed on hospital day 1, prompting transition to 30 mg Semglee from IV insulin. The patient's blood sugar has remained in the high 100s to low 200s on Semglee here in the hospital, though he has had decreased PO intake related to symptoms of esophagitis. Beta hydroxybutyrate has normalized. The patient's A1C was 13.0. The only other documented A1C in epic was 7.0 approximately 4 years ago. DKA suspected to be from nonadherence to medications as well as cocaine abuse. Given A1C of 13.0, the patient will be discharged on Semglee 30 mg in addition to SSI. Diabetes educator has seen the patient during hospitalization, and he states that he feels comfortable administering insulin. Plan for close PCP follow-up with [facility] primary care.   Hypernatremia Patient presented in DKA with corrected sodium of 153. He received 800 mL of 0.45% saline as well as LR and normal saline, and his sodium has now improved to 136. Suspect hypernatremia secondary to hypotonic renal losses in the setting of DKA.  Hypokalemia The patient presented with potassium 5.0 in the setting of DKA. His potassium was as low as 2.8 after receiving insulin for treatment of his DKA. The patient's potassium was repleted, and his potassium has improved to 3.4. Plan for BMP at close PCP follow-up for further evaluation of hypokalemia.  Lactic Acidosis The patient's lactate on admission was 7.0 that trended down to 2.3 with fluids and resolution of his DKA. Lactic acidosis likely secondary to DKA, though the patient did present hypothermic with  temperature of 94.3 which could have contributed to lactic acidosis. Metformin could be contributing as well though this is less likely given resolution with hydration and him being on chronic Metformin therapy. The patient was initially given Vancomycin and Cefepime while blood cultures were pending given concern for sepsis, but these  were discontinued after 48 hour blood cultures were negative.  Acute Kidney Injury The patient's creatinine on admission was 2.73. Patient did not appear to have a clear baseline creatinine, though this is likely AKI given that his creatinine has improved to 0.86 after fluids and resolution of his DKA. Suspect prerenal AKI secondary to dehydration given increased urine output and poor PO intake prior to hospital admission. Plan to encourage adequate fluid intake at discharge.  Esophagitis In addition to DKA, the patient presented complaining of odynophagia and difficulty swallowing solid foods, stating that he felt that food was getting stuck. He reported that this was causing him to have decreased PO intake prior to admission. He visited the ED a few days prior to admission and was prescribed fluconazole given concern for fungal cause of esophagitis. He states that this mildly improved his symptoms, but he continued to have difficulty swallowing. The patient underwent barium swallow during this admission that showed mucosal irregularity with possible linear ulceration in the distal esophagus. He subsequently underwent EGD that showed LA grade D erosive esophagitis and gastritis with some evidence of bleeding but no active bleeding. The patient's hemoglobin during this admission has been stable throughout admission. GI is recommending soft diet, Protonix 40 mg BID for 3 months, Carafate 1 g TID for 4 weeks, repeat EGD in 3 months, and avoidance of NSAIDs. The patient has been able to tolerate soft diet here in the hospital and feels that he will be able to swallow pills at home, so he will be discharged on GI recommendations with outpatient GI follow-up in 3 months.   Hypertension Patient with longstanding hypertension on HCTZ 12.5 mg daily and 20 mg daily at home. This was initially held during admission but has since been restarted and will be continued at discharge.    Hyperlipidemia Patient with  longstanding hypertension on Crestor 20 mg daily at home. This was initially held during admission but has since been restarted and will be continued at discharge.    Cocaine Use Disorder, Alcohol Use Disorder Patient endorses cocaine use approximately once per month. Unclear whether patient was using alcohol prior to admission given his encephalopathy on admission. Patient was placed on CIWA protocol but did not require Ativan.   Benign Prostatic Hypertrophy Patient with longstanding BPH on Flomax at home. This was continued during admission and will be continued at discharge.    Discharge Exam:   BP (!) 128/59 (BP Location: Right Arm)   Pulse 76   Temp (!) 97.3 F (36.3 C) (Oral)   Resp 16   Ht 6' (1.829 m)   Wt 104.3 kg   SpO2 (!) 88%   BMI 31.19 kg/m  Discharge exam:  Constitutional: Appears well-developed and well-nourished. No distress.  HENT: Normocephalic and atraumatic, EOMI, conjunctiva normal, moist mucous membranes Cardiovascular: Normal rate, regular rhythm, S1 and S2 present, no murmurs, rubs, gallops.  Distal pulses intact. Respiratory: Effort is normal on room air.  Lungs are clear to auscultation bilaterally. GI: Soft. Non-distended. Non-tender. No appreciable organomegaly.  Musculoskeletal: Normal bulk and tone.  No peripheral edema noted. Neurological: Alert and oriented x4, no apparent focal deficits noted. Skin: Warm and dry.  No rash,  erythema, lesions noted. Psychiatric: Normal mood and affect. Behavior is normal.   Pertinent Labs, Studies, and Procedures:   DG Chest Port 1 View  Result Date: 03/13/2022 CLINICAL DATA:  Weakness, hypothermia EXAM: PORTABLE CHEST 1 VIEW COMPARISON:  None Available. FINDINGS: The heart size and mediastinal contours are within normal limits. Both lungs are clear. The visualized skeletal structures are unremarkable. IMPRESSION: No active disease. Electronically Signed   By: Keane Police D.O.   On: 03/13/2022 13:33       Latest  Ref Rng & Units 03/16/2022    2:32 AM 03/15/2022    1:12 AM 03/13/2022    7:19 PM  CBC  WBC 4.0 - 10.5 K/uL 9.5   15.7     15.8   26.3    Hemoglobin 13.0 - 17.0 g/dL 13.0   13.5     13.8   15.8    Hematocrit 39.0 - 52.0 % 36.6   39.6     39.0   46.2    Platelets 150 - 400 K/uL 168   199     198   301         Latest Ref Rng & Units 03/17/2022    5:47 AM 03/17/2022    1:39 AM 03/16/2022    6:38 PM  BMP  Glucose 70 - 99 mg/dL  146     BUN 8 - 23 mg/dL  8     Creatinine 0.61 - 1.24 mg/dL  0.86     Sodium 135 - 145 mmol/L  136     Potassium 3.5 - 5.1 mmol/L 3.3   3.4   3.2    Chloride 98 - 111 mmol/L  106     CO2 22 - 32 mmol/L  24     Calcium 8.9 - 10.3 mg/dL  8.2       Discharge Instructions:   You were hospitalized for diabetic ketoacidosis (DKA). This can occur when your blood sugar becomes so high that your body produces too much acid in response. We gave you insulin and IV fluids here in the hospital that improved your blood sugars and your symptoms.  You were also found to have inflammation of your esophagus (esophagitis) and stomach (gastritis) after your endoscopy. We started you on new medications to help with these symptoms.  Thank you for allowing [place] to be part of your care.   We arranged for you to follow up at: [address] [facility]   Please note these changes made to your medications:   Please START taking: Protonix 40 mg twice per day  Carafate 1 g three times per day        Insulin Semglee 30 mg per day  Please STOP taking: NSAID medications such as ibuprofen, naproxen   Please make sure to stay hydrated and take your insulin as prescribed to prevent future hospital visits.   Please call our clinic if you have any questions or concerns, we may be able to help and keep you from a long and expensive emergency room wait. Our clinic and after hours phone number is [phone], the best time to call is Monday through Friday 9 am to 4 pm but there is always  someone available 24/7 if you have an emergency. If you need medication refills please notify your pharmacy one week in advance and they will send [place] a request.   Signed: Elane Fritz, Medical Student 03/17/2022, 2:24 PM   Pager: [phone]

## 2022-03-17 NOTE — Brief Op Note (Signed)
03/13/2022 - 03/17/2022  10:50 AM  PATIENT:  Craig [name]  71 y.o. male  PRE-OPERATIVE DIAGNOSIS:  Abnormal barium swallow  POST-OPERATIVE DIAGNOSIS:  esophageal ulcers, gastric biopsies r/o hpylori, esophageal biopsies r/o candida, severe esophagitis  PROCEDURE:  Procedure(s): ESOPHAGOGASTRODUODENOSCOPY (EGD) WITH PROPOFOL (N/A) BIOPSY  SURGEON:  Surgeon(s) and Role:    * Amalie Koran, MD - Primary  Findings ------------ -EGD showed severe LA grade D erosive esophagitis and gastritis.  Some evidence of recent bleeding but no active bleeding.  Biopsies taken  Recommendation ------------------------- -Start pured diet and slowly advance as tolerated to soft diet -Recommend Protonix IV 40 mg twice a day while in hospital and recommend to discharge him on Protonix 40 mg twice a day for 3 months. -Carafate 1 g 3 times a day at least for 4 weeks -Commend repeat EGD in 3 months to document healing of esophagitis -Avoid NSAIDs -No further inpatient GI work-up planned.  GI will sign off.  Call us back if needed  Kathi Der MD, FACP 03/17/2022, 10:52 AM  Contact #  [phone]

## 2022-03-17 NOTE — Progress Notes (Signed)
PT Cancellation Note  Patient Details Name: Craig [name] MRN: [id number] DOB: [birth year]   Cancelled Treatment:    Reason Eval/Treat Not Completed: (P) Patient at procedure or test/unavailable (at Endoscopy dept for EGD.) Will continue efforts per PT plan of care as schedule permits.   Dorathy Kinsman Durrel Mcnee 03/17/2022, 10:55 AM

## 2022-03-17 NOTE — Evaluation (Signed)
SLP Cancellation Note  Patient Details Name: Craig [name] MRN: [id number] DOB: 1951/07/23   Cancelled treatment:       Reason Eval/Treat Not Completed: Other (comment) (Pt to under endoscopy today due to abnormal esophagram.   Pharyngeal swallow function deemed normal on esophagram therefore SLP will sign off; please reorder if desire oropharyngeal evaluation. Thanks.)   Chales Abrahams 03/17/2022, 7:31 AM

## 2022-03-17 NOTE — Op Note (Signed)
[facility] Patient Name: Craig [name] Procedure Date : 03/17/2022 MRN: [id number] Attending MD: Kathi Der , MD Date of Birth: 1951-06-18 CSN: [id number] Age: 71 Admit Type: Inpatient Procedure:                Upper GI endoscopy Indications:              Dysphagia, Abnormal CT of the GI tract Providers:                Kathi Der, MD, Lorenza Evangelist, RN, Michele Mcalpine Technician, Irene Shipper, Technician Referring MD:              Medicines:                Sedation Administered by an Anesthesia Professional Complications:            No immediate complications. Estimated Blood Loss:     Estimated blood loss was minimal. Procedure:                Pre-Anesthesia Assessment:                           - Prior to the procedure, a History and Physical                            was performed, and patient medications and                            allergies were reviewed. The patient's tolerance of                            previous anesthesia was also reviewed. The risks                            and benefits of the procedure and the sedation                            options and risks were discussed with the patient.                            All questions were answered, and informed consent                            was obtained. Prior Anticoagulants: The patient has                            taken no previous anticoagulant or antiplatelet                            agents. ASA Grade Assessment: II - A patient with                            mild systemic disease. After reviewing the risks  and benefits, the patient was deemed in                            satisfactory condition to undergo the procedure.                           After obtaining informed consent, the endoscope was                            passed under direct vision. Throughout the                            procedure, the patient's  blood pressure, pulse, and                            oxygen saturations were monitored continuously. The                            [id number]-[id number] ([id number]) Olympus endoscope was introduced                            through the mouth, and advanced to the second part                            of duodenum. The upper GI endoscopy was                            accomplished without difficulty. The patient                            tolerated the procedure well. Scope In: Scope Out: Findings:      LA Grade D (one or more mucosal breaks involving at least 75% of       esophageal circumference) esophagitis with no bleeding was found in the       Mid and distal esophagus. Biopsies were taken with a cold forceps for       histology.      Scattered mild inflammation characterized by congestion (edema),       erosions and erythema was found in the gastric antrum and in the       prepyloric region of the stomach. Biopsies were taken with a cold       forceps for histology.      The cardia and gastric fundus were normal on retroflexion.      The duodenal bulb, first portion of the duodenum and second portion of       the duodenum were normal. Impression:               - LA Grade D erosive esophagitis with no bleeding.                            Biopsied.                           - Gastritis. Biopsied.                           -  Normal duodenal bulb, first portion of the                            duodenum and second portion of the duodenum. Recommendation:           - Return patient to hospital ward for ongoing care.                           - Pureed diet.                           - Use Protonix (pantoprazole) 40 mg PO BID.                           - Use sucralfate suspension 1 gram PO QID.                           - Repeat upper endoscopy in 3 months to check                            healing.                           - Return to GI office in 2 months.                           - No  ibuprofen, naproxen, or other non-steroidal                            anti-inflammatory drugs. Procedure Code(s):        --- Professional ---                           [phone], Esophagogastroduodenoscopy, flexible,                            transoral; with biopsy, single or multiple Diagnosis Code(s):        --- Professional ---                           K20.80, Other esophagitis without bleeding                           K29.70, Gastritis, unspecified, without bleeding                           R13.10, Dysphagia, unspecified                           R93.3, Abnormal findings on diagnostic imaging of                            other parts of digestive tract CPT copyright 2019 American Medical Association. All rights reserved. The codes documented in this report are preliminary and upon coder review may  be revised to meet current compliance requirements. Kathi DerParag Temprance Wyre, MD Kathi DerParag Wynne Rozak, MD 03/17/2022 10:38:01 AM Number  of Addenda: 0

## 2022-03-17 NOTE — Progress Notes (Signed)
Patient returned from endoscopy Procedure. Vitals taken and stable. Patient placed on dysphagia diet. Menu provided to order lunch.  Craig [name]

## 2022-03-17 NOTE — Anesthesia Preprocedure Evaluation (Addendum)
Anesthesia Evaluation  Patient identified by MRN, date of birth, ID band Patient awake    Reviewed: Allergy & Precautions, NPO status , Patient's Chart, lab work & pertinent test results  Airway Mallampati: II  TM Distance: >3 FB Neck ROM: Full    Dental  (+) Dental Advisory Given, Edentulous Upper   Pulmonary neg pulmonary ROS,    breath sounds clear to auscultation       Cardiovascular hypertension, Pt. on medications  Rhythm:Regular Rate:Normal     Neuro/Psych negative neurological ROS     GI/Hepatic negative GI ROS, Neg liver ROS,   Endo/Other  diabetes, Type 2, Oral Hypoglycemic Agents  Renal/GU negative Renal ROS     Musculoskeletal   Abdominal   Peds  Hematology negative hematology ROS (+)   Anesthesia Other Findings   Reproductive/Obstetrics                            Anesthesia Physical Anesthesia Plan  ASA: 2  Anesthesia Plan: MAC   Post-op Pain Management: Minimal or no pain anticipated   Induction:   PONV Risk Score and Plan: 1 and Propofol infusion and Treatment may vary due to age or medical condition  Airway Management Planned: Natural Airway and Nasal Cannula  Additional Equipment:   Intra-op Plan:   Post-operative Plan:   Informed Consent: I have reviewed the patients History and Physical, chart, labs and discussed the procedure including the risks, benefits and alternatives for the proposed anesthesia with the patient or authorized representative who has indicated his/her understanding and acceptance.       Plan Discussed with:   Anesthesia Plan Comments:         Anesthesia Quick Evaluation

## 2022-03-17 NOTE — Progress Notes (Signed)
Inpatient Diabetes Program Recommendations  AACE/ADA: New Consensus Statement on Inpatient Glycemic Control   Target Ranges:  Prepandial:   less than 140 mg/dL      Peak postprandial:   less than 180 mg/dL (1-2 hours)      Critically ill patients:  140 - 180 mg/dL    Latest Reference Range & Units 03/17/22 03:37 03/17/22 06:33 03/17/22 07:55  Glucose-Capillary 70 - 99 mg/dL 102 (H) 585 (H) 277 (H)    Latest Reference Range & Units 03/16/22 08:25 03/16/22 12:04 03/16/22 17:45 03/16/22 19:48 03/16/22 23:32  Glucose-Capillary 70 - 99 mg/dL 824 (H) 235 (H) 361 (H) 323 (H) 172 (H)    Latest Reference Range & Units 03/13/22 21:43  Hemoglobin A1C 4.8 - 5.6 % 13.0 (H)   Review of Glycemic Control  Diabetes history: DM2 Outpatient Diabetes medications: Metformin 1000 mg BID, Ozempic 1 mg Qweek Current orders for Inpatient glycemic control: Semglee 30  units daily, Novolog 0-9 units Q4H  Inpatient Diabetes Program Recommendations:    Insulin: Once diet is resumed, please consider ordering Novolog 5 units TID with meals for meal coverage if patient eats at least 50% of meals.  HbgA1C:  A1C 13% on 03/13/22. Inpatient diabetes coordinator spoke with patient on 03/15/22 and patient reported that he had been out of Ozempic for 3 months. Patient was educated on insulin pens. If patient is discharged on insulin, please prescribe insulin pens and insulin pen needles.  Thanks, Orlando Penner, RN, MSN, CDCES Diabetes Coordinator Inpatient Diabetes Program [phone] (Team Pager from 8am to 5pm)

## 2022-03-17 NOTE — Interval H&P Note (Signed)
History and Physical Interval Note:  03/17/2022 9:46 AM  Craig [name]  has presented today for surgery, with the diagnosis of Abnormal barium swallow.  The various methods of treatment have been discussed with the patient and family. After consideration of risks, benefits and other options for treatment, the patient has consented to  Procedure(s): ESOPHAGOGASTRODUODENOSCOPY (EGD) WITH PROPOFOL (N/A) as a surgical intervention.  The patient's history has been reviewed, patient examined, no change in status, stable for surgery.  I have reviewed the patient's chart and labs.  Questions were answered to the patient's satisfaction.     [name]

## 2022-03-17 NOTE — Transfer of Care (Signed)
Immediate Anesthesia Transfer of Care Note  Patient: Amiri S Maggart  Procedure(s) Performed: ESOPHAGOGASTRODUODENOSCOPY (EGD) WITH PROPOFOL BIOPSY  Patient Location: Endoscopy Unit  Anesthesia Type:MAC  Level of Consciousness: awake, oriented and patient cooperative  Airway & Oxygen Therapy: Patient Spontanous Breathing and Patient connected to nasal cannula oxygen  Post-op Assessment: Report given to RN and Post -op Vital signs reviewed and stable  Post vital signs: Reviewed  Last Vitals:  Vitals Value Taken Time  BP 133/61 03/17/22 1037  Temp    Pulse 85 03/17/22 1038  Resp 11 03/17/22 1038  SpO2 99 % 03/17/22 1038  Vitals shown include unvalidated device data.  Last Pain:  Vitals:   03/17/22 0856  TempSrc: Temporal  PainSc: 0-No pain      Patients Stated Pain Goal: 0 (62/22/97 9892)  Complications: No notable events documented.

## 2022-03-17 NOTE — Progress Notes (Addendum)
Subjective:  Overnight Events: No acute events or concerns overnight.  The patient was seen and evaluated on rounds this morning. He states that he is feeling much better than when he originally came in. He does state that he has continued to have a mildly sore throat and states he was unable to eat a hamburger yesterday. He has been NPO today for his EGD this morning. No acute concerns at this time.  Objective:  Vital signs in last 24 hours: Vitals:   03/16/22 1123 03/16/22 1744 03/16/22 1941 03/16/22 2331  BP: 136/74 128/68 (!) 91/54 100/66  Pulse: 87 94 94 91  Resp: 18 18 17 19   Temp: (!) 97.4 F (36.3 C) (!) 97.3 F (36.3 C) 98.2 F (36.8 C) (!) 97.5 F (36.4 C)  TempSrc: Oral Oral Oral Oral  SpO2: 99% 100% 98% 99%  Weight:      Height:       Weight change:   Intake/Output Summary (Last 24 hours) at 03/17/2022 03/19/2022 Last data filed at 03/16/2022 1747 Gross per 24 hour  Intake --  Output 425 ml  Net -425 ml    Physical Exam   Constitutional: well appearing and sitting in bed, in no acute distress HENT: normocephalic atraumatic, mucous membranes moist Eyes: pupils equal and round, conjunctiva non-erythematous Cardiovascular: regular rate with normal rhythm, no m/r/g Pulmonary/Chest: normal work of breathing on room air, lungs clear to auscultation bilaterally Abdominal: soft, non-tender, non-distended, bowel sounds present MSK: normal bulk and tone, 5/5 strength in bilateral upper and lower extremities.  Skin: warm and dry. Neurological: alert and answering questions appropriately. Psych: appropriate mood and affect   Assessment/Plan:  Principal Problem:   DKA (diabetic ketoacidosis) (HCC) Active Problems:   Hypokalemia   Esophageal dysphagia  Craig 03/18/2022 [name] is a 71 y.o. male with past medical history of hypertension, hyperlipidemia, type 2 diabetes mellitus who presented to the ED for blurry vision, nausea, vomiting, confusion after being out of his  Ozempic for 3 months and was found to be in DKA on admission.   #Type 2 Diabetes Mellitus #Diabetic ketoacidosis #Anion Gap Metabolic Acidosis #Hypernatremia, resolved #Hypokalemia Anion gap remains closed at 9. Patient's potassium is 3.4 today improved from 2.7 yesterday. Bicarb normalized to 24. Na 136 today. CBG predominantly in the high 100s and low 200s. Patient currently NPO for EGD to evaluate odynophagia with possible esophagitis vs mucosal lesion on barium swallow. -Semglee 30 u -moderate SSI -Encourage p.o. hydration and intake after EGD -Monitor CBG -PT/OT eval (PT recommends HHPT) -Consult to diabetes coordinator, appreciate assistance   #Lactic Acidosis, resolved Bcx no growth for 2 day. Lactic acid initially 7.0 but has been downtrending. Vitals remain stable, afebrile on room air.  Lactic acidosis likely multifactorial likely secondary to poor p.o. intake, hypothermia, as well as possibly metformin. -Discontinue cefepime and Vancomycin given negative blood cultures   #Acute Kidney Injury, resolved Scr 0.94 today -AM BMP -Encourage PO fluid intake after EGD. NPO now.   #Esophagitis Continues to complain of dysphagia.  Patient's esophagram showed mucosal irregularity with possible linear ulceration in the distal esophagus. Radiology recommended considering endoscopic evaluation to differentiate esophagitis from mucosal lesion. Spoke with GI Dr 65 who will take the patient for EGD at 0930 today. Dr Levora Angel recommended replacing potassium and NPO starting at midnight. -Potassium 3.4 this morning. Will continue to replace. -NPO for EGD at 0930 -Continue fluconazole -SLP to evaluate and treat.   #Hypertension Patient with longstanding hypertension on HCTZ 12.5 mg  daily and Lisinopril 20 mg daily. Initial plan was to hold home meds in the setting of severe illness. Latest BP 100/66 -Continue home lisinopril and HCTZ, Cr stable   #Hyperlipidemia Patient with  longstanding hyperlipidemia on Crestor 20 mg daily. Initial plan was to hold home meds in the setting of severe illness. -Continue home Crestor   #Cocaine Use Disorder #Alcohol Use Disorder Patient endorses cocaine use approximately once per month. Unclear whether patient was also using alcohol due to encephalopathy.  -TOC consult for substance use -CIWA protocol x72 hours (0,0,0), discontinued   #Benign Prostatic Hypertrophy -Continue home Flomax  Diet: NPO for EGD VTE: Holding for EGD IVF: None Code: Full   Prior to Admission Living Arrangement: Home Anticipated Discharge Location: Home Barriers to Discharge: Medical stability Dispo: Anticipated discharge in approximately 1-2 day(s).    LOS: 4 days   Bishop Limbo, Medical Student 03/17/2022, 7:11 AM  Attestation for Student Documentation:  I personally was present and performed or re-performed the history, physical exam and medical decision-making activities of this service and have verified that the service and findings are accurately documented in the student's note.  Park Pope, MD 03/17/2022, 11:54 AM

## 2022-03-17 NOTE — Discharge Instructions (Signed)
You were hospitalized for diabetic ketoacidosis (DKA). This can occur when your blood sugar becomes so high that your body produces too much acid in response. We gave you insulin and IV fluids here in the hospital that improved your blood sugars and your symptoms.  You were also found to have inflammation of your esophagus (esophagitis) and stomach (gastritis) after your endoscopy. We started you on new medications to help with these symptoms.  Thank you for allowing [place] to be part of your care.   We arranged for you to follow up at: [address] [facility]   Please note these changes made to your medications:   Please START taking: Protonix 40 mg twice per day  Carafate 1 g three times per day        Insulin Semglee 30 mg per day  Please STOP taking: NSAID medications such as ibuprofen, naproxen   Please make sure to stay hydrated and take your insulin as prescribed to prevent future hospital visits.   Please call our clinic if you have any questions or concerns, we may be able to help and keep you from a long and expensive emergency room wait. Our clinic and after hours phone number is [phone], the best time to call is Monday through Friday 9 am to 4 pm but there is always someone available 24/7 if you have an emergency. If you need medication refills please notify your pharmacy one week in advance and they will send [place] a request.

## 2022-03-18 LAB — CULTURE, BLOOD (ROUTINE X 2)
Culture: NO GROWTH
Culture: NO GROWTH

## 2022-03-18 NOTE — Anesthesia Postprocedure Evaluation (Signed)
Anesthesia Post Note  Patient: Craig Young  Procedure(s) Performed: ESOPHAGOGASTRODUODENOSCOPY (EGD) WITH PROPOFOL BIOPSY     Patient location during evaluation: PACU Anesthesia Type: MAC Level of consciousness: awake and alert Pain management: pain level controlled Vital Signs Assessment: post-procedure vital signs reviewed and stable Respiratory status: spontaneous breathing, nonlabored ventilation, respiratory function stable and patient connected to nasal cannula oxygen Cardiovascular status: stable and blood pressure returned to baseline Postop Assessment: no apparent nausea or vomiting Anesthetic complications: no   No notable events documented.  Last Vitals:  Vitals:   03/17/22 1112 03/17/22 1553  BP: (!) 128/59 125/75  Pulse: 76 81  Resp: 16 17  Temp: (!) 36.3 C (!) 36.3 C  SpO2: (!) 88% 100%    Last Pain:  Vitals:   03/17/22 1553  TempSrc: Oral  PainSc:                  Tiajuana Amass

## 2022-03-19 LAB — COMPREHENSIVE METABOLIC PANEL
ALT: 10 U/L (ref 0–44)
AST: 10 U/L — ABNORMAL LOW (ref 15–41)
Albumin: 1.8 g/dL — ABNORMAL LOW (ref 3.5–5.0)
Alkaline Phosphatase: 38 U/L (ref 38–126)
BUN: 25 mg/dL — ABNORMAL HIGH (ref 8–23)
CO2: <7 mmol/L — ABNORMAL LOW (ref 22–32)
Calcium: 4.8 mg/dL — CL (ref 8.9–10.3)
Chloride: 127 mmol/L — ABNORMAL HIGH (ref 98–111)
Creatinine, Ser: 1.24 mg/dL (ref 0.61–1.24)
GFR, Estimated: >60 mL/min (ref >60)
Glucose, Bld: 427 mg/dL — ABNORMAL HIGH (ref 70–99)
Potassium: 2.5 mmol/L — CL (ref 3.5–5.1)
Sodium: 147 mmol/L — ABNORMAL HIGH (ref 135–145)
Total Bilirubin: 0.8 mg/dL (ref 0.3–1.2)
Total Protein: 3.3 g/dL — ABNORMAL LOW (ref 6.5–8.1)

## 2022-03-22 LAB — SURGICAL PATHOLOGY

## 2022-03-22 NOTE — Discharge Summary (Addendum)
Name: Craig Young MRN: [id number] DOB: 05-Aug-1951 71 y.o. PCP: Andree Moro, DO  Date of Admission: 03/13/2022 11:47 AM Date of Discharge: 03-17-2022 Attending Physician: No att. providers found  Discharge Diagnosis: Type 2 Diabetes, Diabetic Ketoacidosis, Anion Gap Metabolic Acidosis Hypernatremia Hypokalemia Lactic Acidosis Acute Kidney Injury Esophagitis Hypertension Hyperlipidemia Cocaine Use Disorder, Alcohol Use Disorder Benign Prostatic Hypertrophy  Discharge Medications: Allergies as of 03/17/2022       Reactions   Atorvastatin Other (See Comments)   Myalgias/cramps        Medication List     STOP taking these medications    aspirin EC 81 MG tablet       TAKE these medications    CoQ-10 100 MG capsule Take 100 mg by mouth daily.   fluconazole 200 MG tablet Commonly known as: DIFLUCAN Take 1 tablet (200 mg total) by mouth daily for 21 days.   glucose blood test strip Commonly known as: OneTouch Verio Use as instructed to check blood sugar 3 times per day.  Dx code E11.40. Non insulin dependent   hydrochlorothiazide 12.5 MG capsule Commonly known as: MICROZIDE Take 12.5 mg by mouth daily.   insulin detemir 100 UNIT/ML FlexPen Commonly known as: LEVEMIR Inject 30 Units into the skin daily.   Insulin Pen Needle 32G X 4 MM Misc Inject 1 each into the skin daily at 8 pm.   lisinopril 20 MG tablet Commonly known as: ZESTRIL Take 20 mg by mouth daily.   metFORMIN 1000 MG tablet Commonly known as: GLUCOPHAGE Take 1,000 mg by mouth 2 (two) times daily with a meal.   multivitamin with minerals Tabs tablet Take 1 tablet by mouth daily.   onetouch ultrasoft lancets Use as instructed What changed: Another medication with the same name was added. Make sure you understand how and when to take each.   onetouch ultrasoft lancets 1 each by Other route daily at 8 pm. What changed: You were already taking a medication with the same name, and  this prescription was added. Make sure you understand how and when to take each.   OneTouch Verio IQ System w/Device Kit Check blood sugar once daily as instructed What changed: Another medication with the same name was added. Make sure you understand how and when to take each.   OneTouch Verio Flex System w/Device Kit Inject 1 each into the skin daily at 8 pm. What changed: You were already taking a medication with the same name, and this prescription was added. Make sure you understand how and when to take each.   Ozempic (1 MG/DOSE) 4 MG/3ML Sopn Generic drug: Semaglutide (1 MG/DOSE) Inject 1 mg into the muscle once a week. Mondays   pantoprazole 40 MG tablet Commonly known as: Protonix Take 1 tablet (40 mg total) by mouth 2 (two) times daily.   rosuvastatin 20 MG tablet Commonly known as: Crestor Take 1 tablet (20 mg total) by mouth daily.   sucralfate 1 g tablet Commonly known as: CARAFATE Take 1 tablet (1 g total) by mouth 4 (four) times daily -  with meals and at bedtime.   tamsulosin 0.4 MG Caps capsule Commonly known as: FLOMAX TAKE 2 CAPSULES (0.8 MG TOTAL) BY MOUTH DAILY AFTER BREAKFAST.         Disposition and follow-up:   Mr.Jarmaine S Eckhardt was discharged from [facility] in Stable condition.  At the hospital follow up visit please address:  1.  A1C, insulin adherence, esophagitis, hypokalemia  2.  Labs / imaging needed at time of follow-up: BMP, glucose, A1C  3.  Pending labs/ test needing follow-up: None  Follow-up Appointments:  Follow-up Information     Gastroenterology, [facility]. Schedule an appointment as soon as possible for a visit in 2 month(s).   Why: Follow-up for esophagitis and to schedule repeat EGD Contact information: [address] [place] [address] [phone]                 Hospital Course by problem list: Type 2 Diabetes, Diabetic Ketoacidosis, Anion Gap Metabolic Acidosis The patient has  history of type 2 diabetes on Ozempic 1 mg weekly and Metformin 1000 mg BID. He presented to the ED complaining of nausea, blurry vision, and polyuria after he ran out of his Ozempic for 3 months. His labs were notable for glucose of 800,  elevated ketoacids, lactic acid of 7.0, anion gap of 36, bicarb of 9, pH of 7.099, and corrected sodium of 153. The patient was placed on Endotool and was initially given 0.45% saline given his hyponatremia. He also received LR and normal saline. The patient's glucose steadily decreased, and his gap closed on hospital day 1, prompting transition to 30 mg Semglee from IV insulin. The patient's blood sugar has remained in the high 100s to low 200s on Semglee here in the hospital, though he has had decreased PO intake related to symptoms of esophagitis. Beta hydroxybutyrate has normalized. The patient's A1C was 13.0. The only other documented A1C in epic was 7.0 approximately 4 years ago. DKA suspected to be from nonadherence to medications as well as cocaine abuse. Given A1C of 13.0, the patient will be discharged on Semglee 30 mg in addition to SSI. Diabetes educator has seen the patient during hospitalization, and he states that he feels comfortable administering insulin. Plan for close PCP follow-up with [facility] primary care.   Hypernatremia Patient presented in DKA with corrected sodium of 153. He received 800 mL of 0.45% saline as well as LR and normal saline, and his sodium has now improved to 136. Suspect hypernatremia secondary to hypotonic renal losses in the setting of DKA.  Hypokalemia The patient presented with potassium 5.0 in the setting of DKA. His potassium was as low as 2.8 after receiving insulin for treatment of his DKA. The patient's potassium was repleted, and his potassium has improved to 3.4. Plan for BMP at close PCP follow-up for further evaluation of hypokalemia.  Lactic Acidosis The patient's lactate on admission was 7.0 that trended down to  2.3 with fluids and resolution of his DKA. Lactic acidosis likely secondary to DKA, though the patient did present hypothermic with temperature of 94.3 which could have contributed to lactic acidosis. Metformin could be contributing as well though this is less likely given resolution with hydration and him being on chronic Metformin therapy. The patient was initially given Vancomycin and Cefepime while blood cultures were pending given concern for sepsis, but these were discontinued after 48 hour blood cultures were negative.  Acute Kidney Injury The patient's creatinine on admission was 2.73. Patient did not appear to have a clear baseline creatinine, though this is likely AKI given that his creatinine has improved to 0.86 after fluids and resolution of his DKA. Suspect prerenal AKI secondary to dehydration given increased urine output and poor PO intake prior to hospital admission. Plan to encourage adequate fluid intake at discharge.  Esophagitis In addition to DKA, the patient presented complaining of odynophagia and difficulty swallowing solid  foods, stating that he felt that food was getting stuck. He reported that this was causing him to have decreased PO intake prior to admission. He visited the ED a few days prior to admission and was prescribed fluconazole given concern for fungal cause of esophagitis. He states that this mildly improved his symptoms, but he continued to have difficulty swallowing. The patient underwent barium swallow during this admission that showed mucosal irregularity with possible linear ulceration in the distal esophagus. He subsequently underwent EGD that showed LA grade D erosive esophagitis and gastritis with some evidence of bleeding but no active bleeding. The patient's hemoglobin during this admission has been stable throughout admission. GI is recommending soft diet, Protonix 40 mg BID for 3 months, Carafate 1 g TID for 4 weeks, repeat EGD in 3 months, and avoidance of  NSAIDs. The patient has been able to tolerate soft diet here in the hospital and feels that he will be able to swallow pills at home, so he will be discharged on GI recommendations with outpatient GI follow-up in 3 months.   Hypertension Patient with longstanding hypertension on HCTZ 12.5 mg daily and 20 mg daily at home. This was initially held during admission but has since been restarted and will be continued at discharge.    Hyperlipidemia Patient with longstanding hypertension on Crestor 20 mg daily at home. This was initially held during admission but has since been restarted and will be continued at discharge.    Cocaine Use Disorder, Alcohol Use Disorder Patient endorses cocaine use approximately once per month. Unclear whether patient was using alcohol prior to admission given his encephalopathy on admission. Patient was placed on CIWA protocol but did not require Ativan.   Benign Prostatic Hypertrophy Patient with longstanding BPH on Flomax at home. This was continued during admission and will be continued at discharge.    Discharge Exam:   BP 125/75 (BP Location: Right Arm)   Pulse 81   Temp (!) 97.4 F (36.3 C) (Oral)   Resp 17   Ht 6' (1.829 m)   Wt 104.3 kg   SpO2 100%   BMI 31.19 kg/m  Discharge exam:  Constitutional: Appears well-developed and well-nourished. No distress.  HENT: Normocephalic and atraumatic, EOMI, conjunctiva normal, moist mucous membranes Cardiovascular: Normal rate, regular rhythm, S1 and S2 present, no murmurs, rubs, gallops.  Distal pulses intact. Respiratory: Effort is normal on room air.  Lungs are clear to auscultation bilaterally. GI: Soft. Non-distended. Non-tender. No appreciable organomegaly.  Musculoskeletal: Normal bulk and tone.  No peripheral edema noted. Neurological: Alert and oriented x4, no apparent focal deficits noted. Skin: Warm and dry.  No rash, erythema, lesions noted. Psychiatric: Normal mood and affect. Behavior is normal.    Pertinent Labs, Studies, and Procedures:   DG Chest Port 1 View  Result Date: 03/13/2022 CLINICAL DATA:  Weakness, hypothermia EXAM: PORTABLE CHEST 1 VIEW COMPARISON:  None Available. FINDINGS: The heart size and mediastinal contours are within normal limits. Both lungs are clear. The visualized skeletal structures are unremarkable. IMPRESSION: No active disease. Electronically Signed   By: Keane Police D.O.   On: 03/13/2022 13:33       Latest Ref Rng & Units 03/16/2022    2:32 AM 03/15/2022    1:12 AM 03/13/2022    7:19 PM  CBC  WBC 4.0 - 10.5 K/uL 9.5   15.7     15.8   26.3    Hemoglobin 13.0 - 17.0 g/dL 13.0   13.5  13.8   15.8    Hematocrit 39.0 - 52.0 % 36.6   39.6     39.0   46.2    Platelets 150 - 400 K/uL 168   199     198   301         Latest Ref Rng & Units 03/17/2022    5:47 AM 03/17/2022    1:39 AM 03/16/2022    6:38 PM  BMP  Glucose 70 - 99 mg/dL  146     BUN 8 - 23 mg/dL  8     Creatinine 0.61 - 1.24 mg/dL  0.86     Sodium 135 - 145 mmol/L  136     Potassium 3.5 - 5.1 mmol/L 3.3   3.4   3.2    Chloride 98 - 111 mmol/L  106     CO2 22 - 32 mmol/L  24     Calcium 8.9 - 10.3 mg/dL  8.2       Discharge Instructions: Discharge Instructions     Call MD for:  persistant nausea and vomiting   Complete by: As directed    Call MD for:  severe uncontrolled pain   Complete by: As directed    Diet - low sodium heart healthy   Complete by: As directed    Discharge instructions   Complete by: As directed    Mr. Geffre,  It was a pleasure taking care of you during this admission.  You were hospitalized for very high blood sugar due to running out of Ozempic.  We were able to bring your blood sugar down with insulin.  Please start injecting 30 units of Levemir daily.  Make sure you check your fasting blood sugar every morning before breakfast.  When you see your primary care doctor at [facility], they will resume your Ozempic.  For your throat pain, the procedure  found severe inflammation and irritation of your food pipe and stomach.  Please start taking Protonix and Carafate as instructed.  Your primary care doctor will make a follow-up appointment with the stomach doctor in 2 months for repeat endoscopy.  Please schedule a follow-up appointment with your primary care doctor in 1 week.  Take care,  Dr. Alfonse Spruce   Increase activity slowly   Complete by: As directed        You were hospitalized for diabetic ketoacidosis (DKA). This can occur when your blood sugar becomes so high that your body produces too much acid in response. We gave you insulin and IV fluids here in the hospital that improved your blood sugars and your symptoms.  You were also found to have inflammation of your esophagus (esophagitis) and stomach (gastritis) after your endoscopy. We started you on new medications to help with these symptoms.  Thank you for allowing [place] to be part of your care.   We arranged for you to follow up at: [address] [facility]   Please note these changes made to your medications:   Please START taking: Protonix 40 mg twice per day  Carafate 1 g three times per day        Insulin Semglee 30 mg per day  Please STOP taking: NSAID medications such as ibuprofen, naproxen   Please make sure to stay hydrated and take your insulin as prescribed to prevent future hospital visits.   Please call our clinic if you have any questions or concerns, we may be able to help and keep you from a long  and expensive emergency room wait. Our clinic and after hours phone number is [phone], the best time to call is Monday through Friday 9 am to 4 pm but there is always someone available 24/7 if you have an emergency. If you need medication refills please notify your pharmacy one week in advance and they will send [place] a request.   Signed: France Ravens, MD 03/22/2022, 2:05 PM   Pager: [phone]

## 2022-05-05 ENCOUNTER — Other Ambulatory Visit: Payer: Self-pay | Admitting: Student

## 2022-05-05 DIAGNOSIS — E1140 Type 2 diabetes mellitus with diabetic neuropathy, unspecified: Secondary | ICD-10-CM

## 2022-05-08 ENCOUNTER — Other Ambulatory Visit: Payer: Self-pay | Admitting: Student

## 2022-05-13 ENCOUNTER — Other Ambulatory Visit: Payer: Self-pay | Admitting: Student

## 2022-07-06 ENCOUNTER — Other Ambulatory Visit: Payer: Self-pay | Admitting: Student

## 2022-12-14 ENCOUNTER — Ambulatory Visit: Payer: 59 | Admitting: Podiatry

## 2023-01-20 ENCOUNTER — Ambulatory Visit (INDEPENDENT_AMBULATORY_CARE_PROVIDER_SITE_OTHER): Payer: 59 | Admitting: Podiatry

## 2023-01-20 DIAGNOSIS — M79674 Pain in right toe(s): Secondary | ICD-10-CM | POA: Diagnosis not present

## 2023-01-20 DIAGNOSIS — M79675 Pain in left toe(s): Secondary | ICD-10-CM | POA: Diagnosis not present

## 2023-01-20 DIAGNOSIS — B351 Tinea unguium: Secondary | ICD-10-CM

## 2023-01-24 ENCOUNTER — Encounter: Payer: Self-pay | Admitting: Podiatry

## 2023-01-24 NOTE — Progress Notes (Signed)
  Subjective:  Patient ID: Craig Young, male    DOB: Dec 24, 1950,  MRN: [id number]  Chief Complaint  Patient presents with   Nail Problem    Onychomycosis Referring Provider: Loura Back    72 y.o. male presents with the above complaint. History confirmed with patient.  His nails are thick and elongated has difficulty cutting them  Objective:  Physical Exam: warm, good capillary refill, no trophic changes or ulcerative lesions, normal DP and PT pulses, and normal sensory exam. Left Foot: dystrophic yellowed discolored nail plates with subungual debris Right Foot: dystrophic yellowed discolored nail plates with subungual debris   Assessment:   1. Pain due to onychomycosis of toenails of both feet      Plan:  Patient was evaluated and treated and all questions answered.   Discussed the etiology and treatment options for the condition in detail with the patient. Educated patient on the topical and oral treatment options for mycotic nails. Recommended debridement of the nails today. Sharp and mechanical debridement performed of all painful and mycotic nails today. Nails debrided in length and thickness using a nail nipper to level of comfort. Discussed treatment options including appropriate shoe gear. Follow up as needed for painful nails.    Return if symptoms worsen or fail to improve.

## 2023-03-22 ENCOUNTER — Ambulatory Visit (INDEPENDENT_AMBULATORY_CARE_PROVIDER_SITE_OTHER): Payer: 59 | Admitting: Podiatry

## 2023-03-22 DIAGNOSIS — Z91199 Patient's noncompliance with other medical treatment and regimen due to unspecified reason: Secondary | ICD-10-CM

## 2023-03-23 NOTE — Progress Notes (Signed)
Patient was no-show for appointment today 

## 2023-06-25 IMAGING — RF DG ESOPHAGUS
12 series · 20 of 24 positions shown · non-contrast
Comparison: None Available.

CLINICAL DATA: 71-year-old male with history of heartburn,
currently hospitalized due to DKA and GEOFF, who presents due to
dysphagia with solid and liquid for 1 month. Patient reports food
getting stuck at epigastric region. Previous CT abdomen pelvis on
03/09/2022 showed distal esophageal wall thickening and adjacent
stranding suggesting esophagitis. Request for esophagram for further
evaluation.

EXAM:
ESOPHAGUS/BARIUM SWALLOW/TABLET STUDY
TECHNIQUE: Combined double and single contrast examination was performed using
effervescent crystals, high-density barium, and thin liquid barium.
This exam was performed by Noferesti, David Dawe, and was supervised and
interpreted by T Ch, Derbyn.
FLUOROSCOPY:
Radiation Exposure Index (as provided by the fluoroscopic device):
13.70 mGy Kerma

[Series 1: cp_standard · 0.34mm/px · 2 of 52 frames shown (1 of 12)]
[frame 8/52]
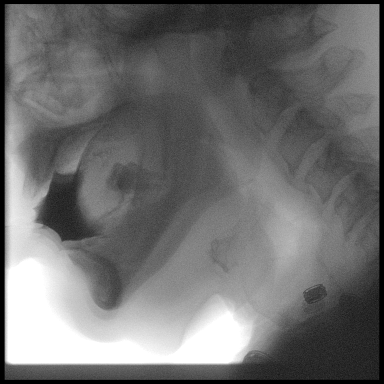
[frame 43/52]
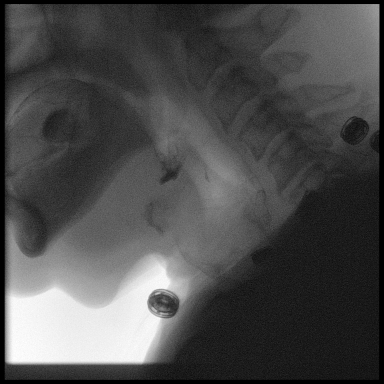

[Series 2: cp_standard · 0.34mm/px · 2 of 32 frames shown (2 of 12)]
[frame 17/32]
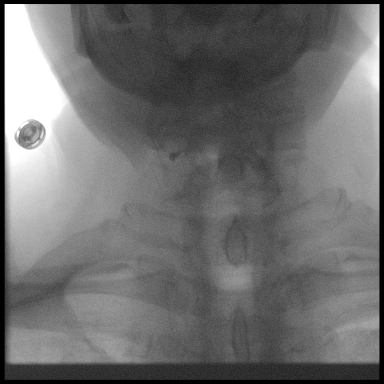
[frame 28/32]
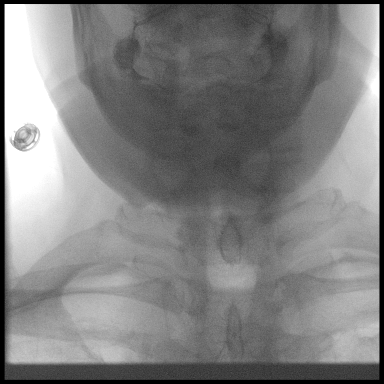

[Series 3: cp_standard · 0.51mm/px · 2 of 59 frames shown (3 of 12)]
[frame 23/59]
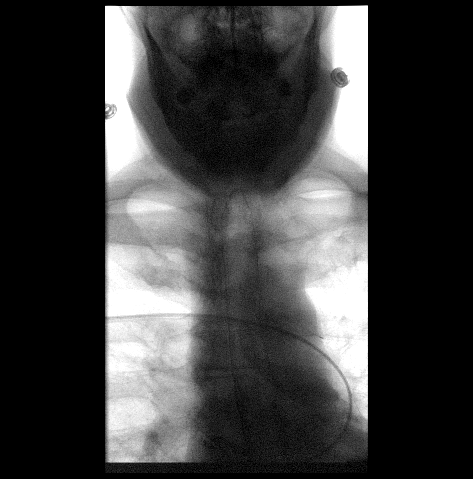
[frame 51/59]
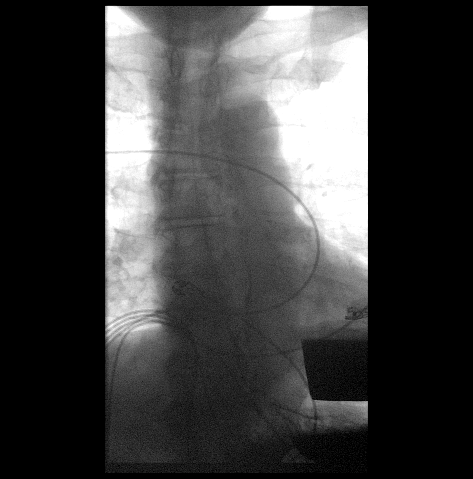

[Series 4: cp_standard · 0.51mm/px · 1 of 38 frames shown (4 of 12)]
[frame 12/38]
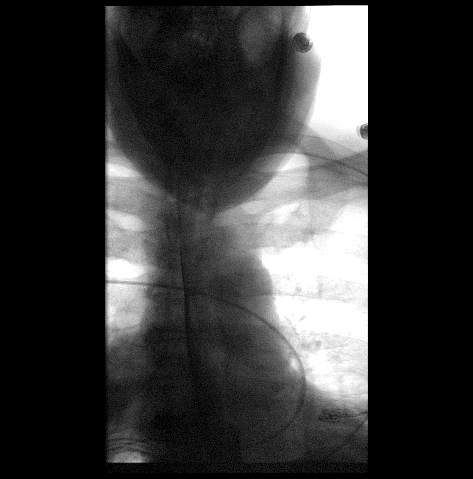

[Series 5: cp_standard · 0.34mm/px · 2 of 62 frames shown (5 of 12)]
[frame 15/62]
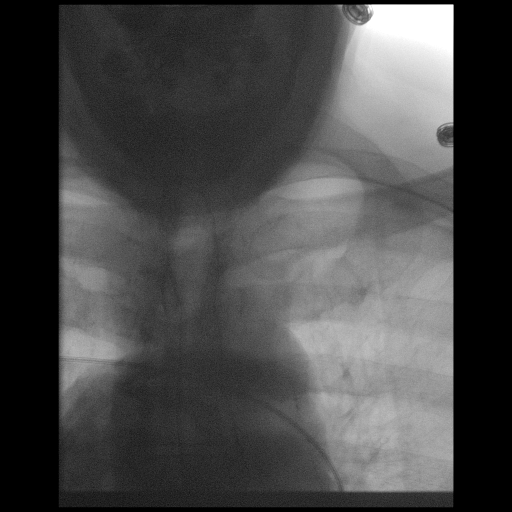
[frame 53/62]
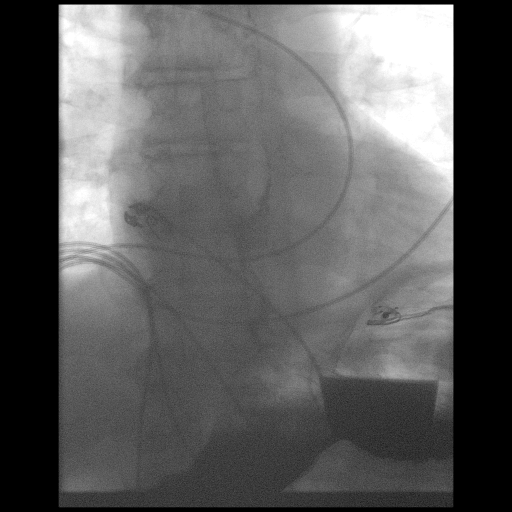

[Series 7: cp_standard · 0.35mm/px · 3 of 31 frames shown (6 of 12)]
[frame 5/31]
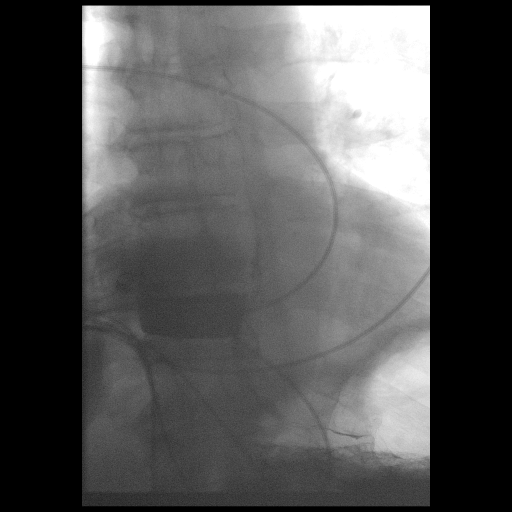
[frame 15/31]
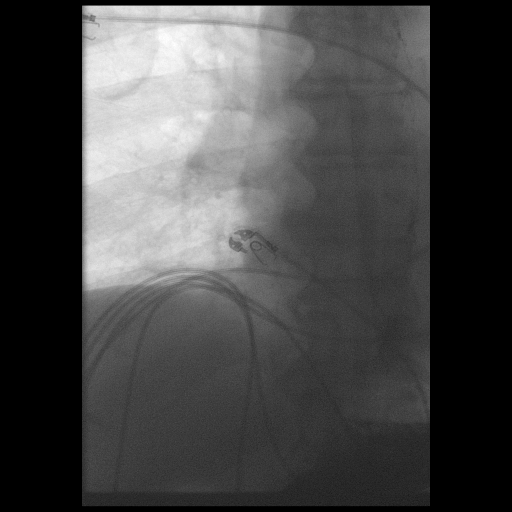
[frame 27/31]
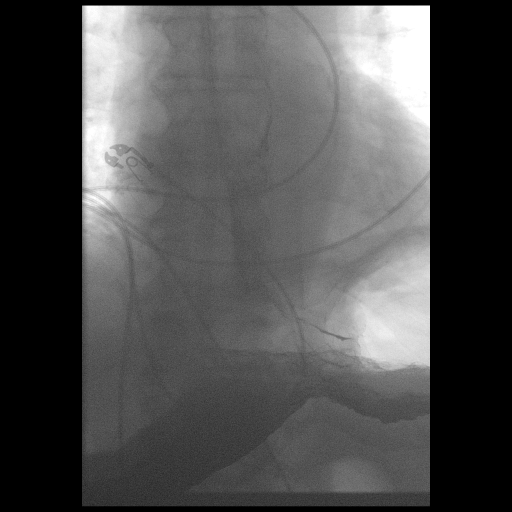

[Series 8: cp_standard · 0.53mm/px · 1 of 139 frames shown (7 of 12)]
[frame 119/139]
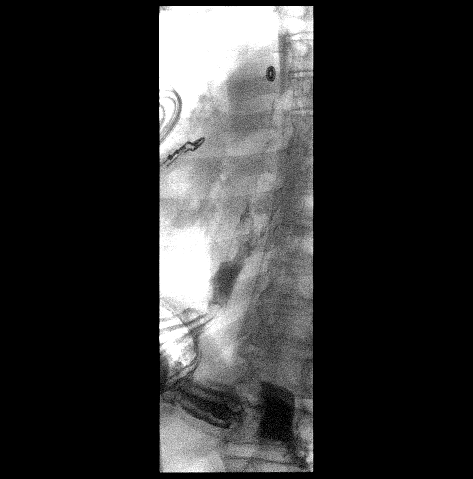

[Series 9: cp_standard · 0.53mm/px · 2 of 134 frames shown (8 of 12)]
[frame 68/134]
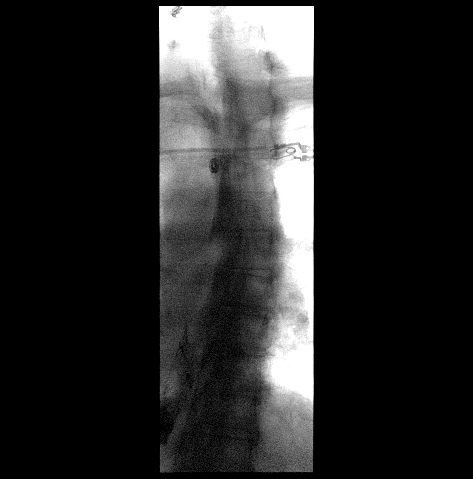
[frame 114/134]
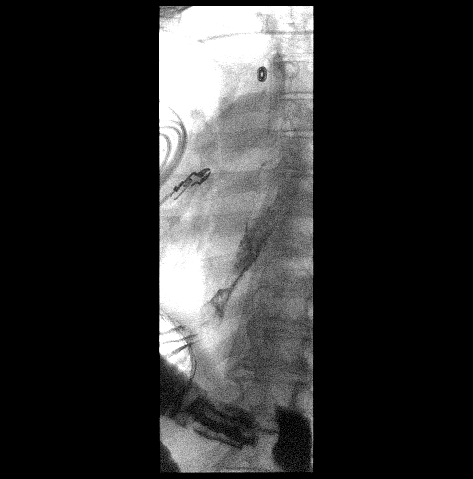

[Series 10: cp_standard · 0.35mm/px · 1 of 50 frames shown (9 of 12)]
[frame 26/50]
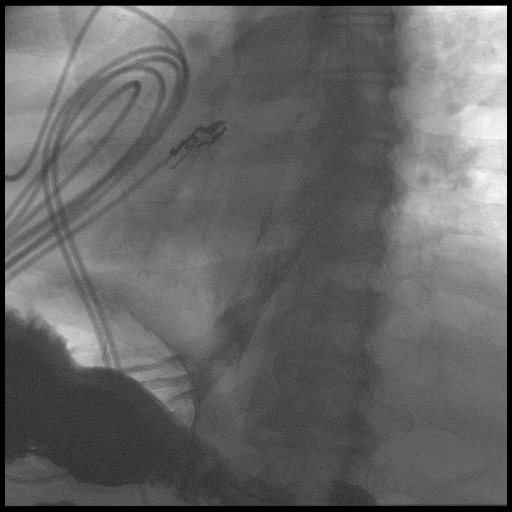

[Series 11: cp_standard · 0.35mm/px · 1 of 20 frames shown (10 of 12)]
[frame 4/20]
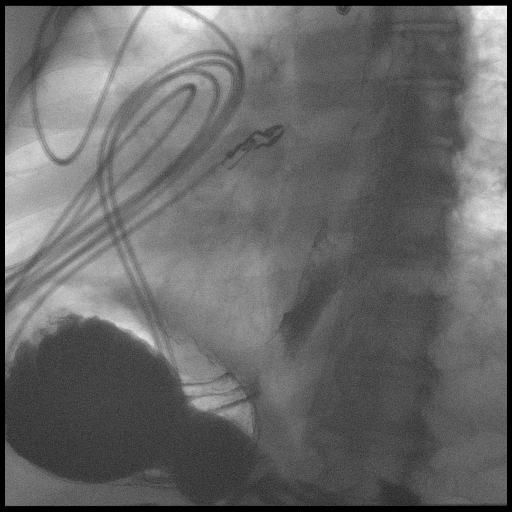

[Series 12: cp_standard · 0.27mm/px · 1 of 1 slices shown (11 of 12)]
[im 1/1]
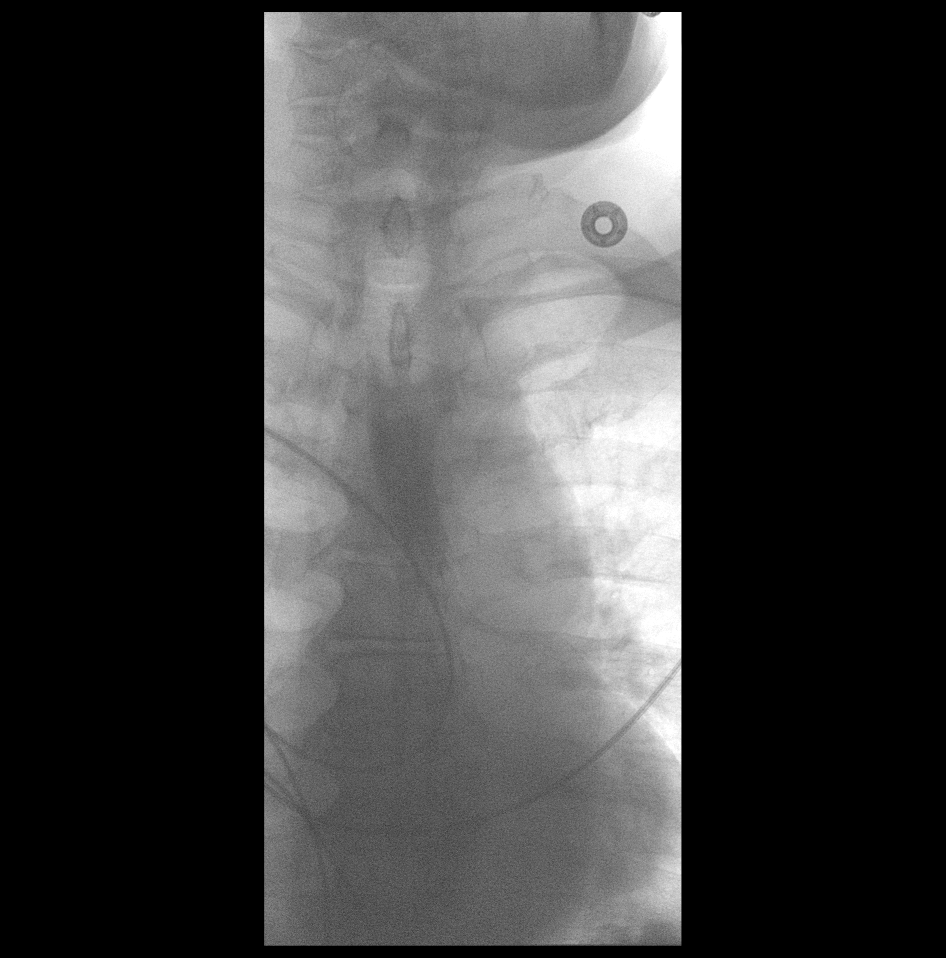

[Series 13: cp_standard · 0.53mm/px · 2 of 31 frames shown (12 of 12)]
[frame 16/31]
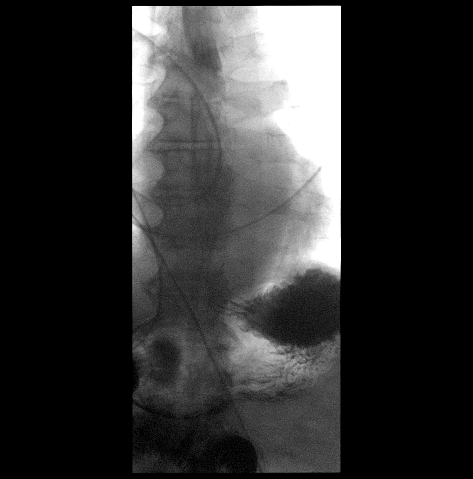
[frame 27/31]
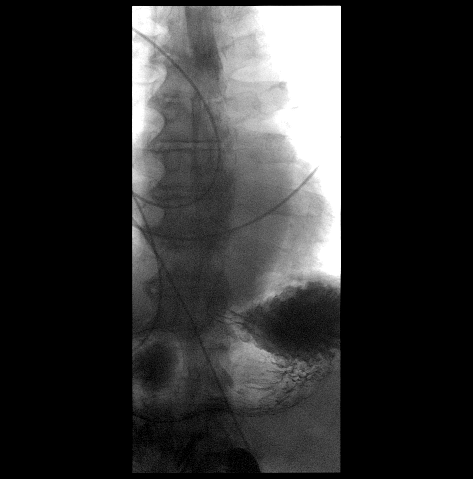

[20 of 24 positions shown; findings below may reference images not displayed]

FINDINGS: Swallowing: Appears normal. No vestibular penetration or aspiration
seen.

Pharynx: Unremarkable.

Esophagus: Mucosal irregularity seen at the distal esophagus, with a
shallow linear ulceration suspected. No high-grade stricture.

Esophageal motility: Within normal limits.

Hiatal Hernia: None.

Gastroesophageal reflux: Moderate gastroesophageal reflux seen.

Ingested 13mm barium tablet: Passed normally.

Other: None.
IMPRESSION: 1. Mucosal irregularity with possible linear ulceration in the
distal esophagus. Consider endoscopic evaluation to differentiate
esophagitis from mucosal lesion.

2.  Moderate gastroesophageal reflux

3.  No hiatal hernia

## 2023-11-30 ENCOUNTER — Emergency Department (HOSPITAL_COMMUNITY)
Admission: EM | Admit: 2023-11-30 | Discharge: 2023-11-30 | Disposition: A | Discharge status: Home / Self Care | Payer: 59 | Attending: Emergency Medicine | Admitting: Emergency Medicine

## 2023-11-30 ENCOUNTER — Encounter (HOSPITAL_COMMUNITY): Payer: Self-pay | Admitting: Emergency Medicine

## 2023-11-30 ENCOUNTER — Other Ambulatory Visit: Payer: Self-pay

## 2023-11-30 DIAGNOSIS — M79605 Pain in left leg: Secondary | ICD-10-CM | POA: Diagnosis present

## 2023-11-30 DIAGNOSIS — L03116 Cellulitis of left lower limb: Secondary | ICD-10-CM | POA: Diagnosis not present

## 2023-11-30 DIAGNOSIS — L0390 Cellulitis, unspecified: Secondary | ICD-10-CM

## 2023-11-30 MED ORDER — AMOXICILLIN-POT CLAVULANATE 875-125 MG PO TABS
1.0000 | ORAL_TABLET | Freq: Once | ORAL | Status: AC
  Administered 2023-11-30: 1 via ORAL
  Filled 2023-11-30: qty 1

## 2023-11-30 MED ORDER — AMOXICILLIN-POT CLAVULANATE 875-125 MG PO TABS: 1.0000 | ORAL_TABLET | Freq: Two times a day (BID) | ORAL | 0 refills | Status: AC

## 2023-11-30 NOTE — ED Triage Notes (Signed)
Patient c/o left foot swelling and pain x 2 days.  Patient reports history of plantar fasciitis and normally puts ben-gay on his foot, but he has had no relief with that. Patient has a history of diabetes.  Patient denies injury or trauma.

## 2023-11-30 NOTE — ED Provider Notes (Signed)
[place] EMERGENCY DEPARTMENT AT [facility] Provider Note   CSN: [id number] Arrival date & time: 11/30/23  2115     History Chief Complaint  Patient presents with   Foot Swelling    HPI Craig Young is a 73 y.o. male presenting for chief complaint of left foot swelling.  He is a 73 year old male with hypertension diabetes and recurrent cellulitis of the left foot.  Been on and off antibiotics.  States he can approximate 48 hours ago.  States he does take his medication tonight but has been here waiting for an hour already. Denies any systemic fevers chills nausea vomiting syncope shortness of breath.  Otherwise at his baseline.  States he follows with [facility] for podiatry care in the outpatient setting..   Patient's recorded medical, surgical, social, medication list and allergies were reviewed in the Snapshot window as part of the initial history.   Review of Systems   Review of Systems  Constitutional:  Negative for chills and fever.  HENT:  Negative for ear pain and sore throat.   Eyes:  Negative for pain and visual disturbance.  Respiratory:  Negative for cough and shortness of breath.   Cardiovascular:  Negative for chest pain and palpitations.  Gastrointestinal:  Negative for abdominal pain and vomiting.  Genitourinary:  Negative for dysuria and hematuria.  Musculoskeletal:  Negative for arthralgias and back pain.  Skin:  Positive for rash. Negative for color change.  Neurological:  Negative for seizures and syncope.  All other systems reviewed and are negative.   Physical Exam Updated Vital Signs BP (!) 222/100 (BP Location: Right Arm)   Pulse (!) 104   Temp 99.1 F (37.3 C)   Resp 16   Wt 105 kg   SpO2 94%   BMI 31.39 kg/m  Physical Exam Vitals and nursing note reviewed.  Constitutional:      General: He is not in acute distress.    Appearance: He is well-developed.  HENT:     Head: Normocephalic and atraumatic.  Eyes:      Conjunctiva/sclera: Conjunctivae normal.  Cardiovascular:     Rate and Rhythm: Normal rate and regular rhythm.     Heart sounds: No murmur heard. Pulmonary:     Effort: Pulmonary effort is normal. No respiratory distress.     Breath sounds: Normal breath sounds.  Abdominal:     Palpations: Abdomen is soft.     Tenderness: There is no abdominal tenderness.  Musculoskeletal:        General: Swelling and tenderness present.     Cervical back: Neck supple.  Skin:    General: Skin is warm and dry.     Capillary Refill: Capillary refill takes less than 2 seconds.  Neurological:     Mental Status: He is alert.  Psychiatric:        Mood and Affect: Mood normal.      ED Course/ Medical Decision Making/ A&P    Procedures Procedures   Medications Ordered in ED Medications  amoxicillin-clavulanate (AUGMENTIN) 875-125 MG per tablet 1 tablet (1 tablet Oral Given 11/30/23 2150)    Medical Decision Making:   This is 73 year old male presenting with left foot swelling and pain.  He is overall well-appearing in no acute distress. Clinically he appears to have cellulitis which is recurrent for the patient of this extremity.  His diabetes definitely puts him at higher risk for complicated illness.  Discussed getting blood work, treating his blood pressure, checking  his blood sugar and completing a full standard emergency department evaluation.  Patient asked how long it would take to do all of that given the number of patients in the lobby. Informed him of the current longest weight being approximately 7 to 9 hours for patients of similar acuity.  He stated that he would not stay for that evaluation and would rather follow-up with his primary podiatrist in the outpatient setting. Given the redness and history of infection as well as his diabetes, I will start him on Augmentin for aggressive antibiosis.  Disposition:  Patient is requesting discharge at this time.  Given patient's understanding of  risk of severe missed diagnosis based on limitations of today's evaluation and risk of interval worsening of disease including life or limb threatening pathology, will participate in shared medical decision making and patient directed discharge at this time.  Patient is welcome to return for further diagnostic evaluation/therapeutic management at any time.  Clinical Impression:  1. Cellulitis, unspecified cellulitis site      Discharge   Final Clinical Impression(s) / ED Diagnoses Final diagnoses:  Cellulitis, unspecified cellulitis site    Rx / DC Orders ED Discharge Orders          Ordered    amoxicillin-clavulanate (AUGMENTIN) 875-125 MG tablet  Every 12 hours        11/30/23 2146              Glyn Ade, MD 11/30/23 2151

## 2023-12-12 DIAGNOSIS — E785 Hyperlipidemia, unspecified: Secondary | ICD-10-CM | POA: Diagnosis not present

## 2023-12-12 DIAGNOSIS — Z79899 Other long term (current) drug therapy: Secondary | ICD-10-CM | POA: Diagnosis not present

## 2023-12-12 DIAGNOSIS — E11630 Type 2 diabetes mellitus with periodontal disease: Secondary | ICD-10-CM | POA: Diagnosis not present

## 2023-12-12 DIAGNOSIS — I110 Hypertensive heart disease with heart failure: Secondary | ICD-10-CM | POA: Diagnosis not present

## 2023-12-12 DIAGNOSIS — E559 Vitamin D deficiency, unspecified: Secondary | ICD-10-CM | POA: Diagnosis not present

## 2023-12-12 DIAGNOSIS — E1142 Type 2 diabetes mellitus with diabetic polyneuropathy: Secondary | ICD-10-CM | POA: Diagnosis not present

## 2023-12-12 DIAGNOSIS — Z0001 Encounter for general adult medical examination with abnormal findings: Secondary | ICD-10-CM | POA: Diagnosis not present

## 2023-12-12 DIAGNOSIS — L03116 Cellulitis of left lower limb: Secondary | ICD-10-CM | POA: Diagnosis not present

## 2023-12-12 DIAGNOSIS — I509 Heart failure, unspecified: Secondary | ICD-10-CM | POA: Diagnosis not present
